# Patient Record
Sex: Male | Born: 1990 | Race: Black or African American | Hispanic: No | Marital: Single | Smoking: Current every day smoker
Health system: Southern US, Community
[De-identification: ages and names within clinical notes are randomized; demographics above are authoritative.]

## PROBLEM LIST (undated history)

## (undated) DIAGNOSIS — I10 Essential (primary) hypertension: Secondary | ICD-10-CM

## (undated) DIAGNOSIS — S61511A Laceration without foreign body of right wrist, initial encounter: Secondary | ICD-10-CM

## (undated) HISTORY — PX: NO PAST SURGERIES: SHX2092

---
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
## 2009-04-14 ENCOUNTER — Emergency Department (HOSPITAL_COMMUNITY): Admission: EM | Admit: 2009-04-14 | Discharge: 2009-04-14 | Payer: Self-pay | Admitting: Emergency Medicine

## 2009-04-17 ENCOUNTER — Emergency Department (HOSPITAL_COMMUNITY): Admission: EM | Admit: 2009-04-17 | Discharge: 2009-04-17 | Payer: Self-pay | Admitting: Emergency Medicine

## 2009-07-12 ENCOUNTER — Emergency Department (HOSPITAL_COMMUNITY): Admission: EM | Admit: 2009-07-12 | Discharge: 2009-07-12 | Payer: Self-pay | Admitting: Emergency Medicine

## 2009-07-15 ENCOUNTER — Emergency Department (HOSPITAL_COMMUNITY): Admission: EM | Admit: 2009-07-15 | Discharge: 2009-07-16 | Payer: Self-pay | Admitting: Emergency Medicine

## 2009-09-18 ENCOUNTER — Emergency Department (HOSPITAL_COMMUNITY): Admission: EM | Admit: 2009-09-18 | Discharge: 2009-09-18 | Payer: Self-pay | Admitting: Emergency Medicine

## 2009-09-21 ENCOUNTER — Emergency Department (HOSPITAL_COMMUNITY): Admission: EM | Admit: 2009-09-21 | Discharge: 2009-09-21 | Payer: Self-pay | Admitting: Emergency Medicine

## 2010-03-23 LAB — URINALYSIS, ROUTINE W REFLEX MICROSCOPIC
Bilirubin Urine: NEGATIVE
Protein, ur: NEGATIVE mg/dL
Urobilinogen, UA: 1 mg/dL (ref 0.0–1.0)
pH: 7.5 (ref 5.0–8.0)

## 2010-10-14 LAB — CULTURE, ROUTINE-ABSCESS

## 2011-07-14 ENCOUNTER — Encounter (HOSPITAL_COMMUNITY): Payer: Self-pay | Admitting: *Deleted

## 2011-07-14 ENCOUNTER — Emergency Department (HOSPITAL_COMMUNITY)
Admission: EM | Admit: 2011-07-14 | Discharge: 2011-07-14 | Disposition: A | Discharge status: Home / Self Care | Payer: Self-pay | Attending: Emergency Medicine | Admitting: Emergency Medicine

## 2011-07-14 DIAGNOSIS — F17200 Nicotine dependence, unspecified, uncomplicated: Secondary | ICD-10-CM | POA: Insufficient documentation

## 2011-07-14 DIAGNOSIS — R197 Diarrhea, unspecified: Secondary | ICD-10-CM | POA: Insufficient documentation

## 2011-07-14 NOTE — ED Notes (Signed)
MD at bedside. 

## 2011-07-14 NOTE — ED Provider Notes (Signed)
History     CSN: [id number]  Arrival date & time 07/14/11  0804   First MD Initiated Contact with Patient 07/14/11 [phone]      Chief Complaint  Patient presents with  . Diarrhea    (Consider location/radiation/quality/duration/timing/severity/associated sxs/prior treatment) HPI PT is concerned he has worms in his stool. States he has had several days of mild (2-3 episodes) of diarrhea. No fever chills, abdominal pain. Was looking at his solid stool today and thought he saw small worms. No recent trip out of the country. States he went camping this past weekend but did not drink any stream or river water.  History reviewed. No pertinent past medical history.  History reviewed. No pertinent past surgical history.  No family history on file.  History  Substance Use Topics  . Smoking status: Current Everyday Smoker  . Smokeless tobacco: Not on file  . Alcohol Use: Yes      Review of Systems  Constitutional: Negative for fever and chills.  Gastrointestinal: Positive for diarrhea. Negative for nausea, vomiting, abdominal pain, constipation and abdominal distention.  Genitourinary: Negative for dysuria and hematuria.  Skin: Negative for rash and wound.  Neurological: Negative for dizziness, weakness, numbness and headaches.  Psychiatric/Behavioral: The patient is nervous/anxious.     Allergies  Review of patient's allergies indicates no known allergies.  Home Medications  No current outpatient prescriptions on file.  BP 167/75  Pulse 86  Temp 98.4 F (36.9 C) (Oral)  Resp 16  Ht 6\' 4"  (1.93 m)  Wt 210 lb (95.255 kg)  BMI 25.56 kg/m2  SpO2 100%  Physical Exam  Nursing note and vitals reviewed. Constitutional: He is oriented to person, place, and time. He appears well-developed and well-nourished. No distress.       Anxious   HENT:  Head: Normocephalic and atraumatic.  Mouth/Throat: Oropharynx is clear and moist.  Eyes: EOM are normal. Pupils are equal, round, and  reactive to light.  Neck: Normal range of motion. Neck supple.  Cardiovascular: Normal rate and regular rhythm.   Pulmonary/Chest: Effort normal and breath sounds normal. No respiratory distress. He has no wheezes. He has no rales.  Abdominal: Soft. Bowel sounds are normal. There is no tenderness. There is no rebound and no guarding.  Musculoskeletal: Normal range of motion. He exhibits no edema and no tenderness.  Neurological: He is alert and oriented to person, place, and time.  Skin: Skin is warm and dry. No rash noted. No erythema.    ED Course  Procedures (including critical care time)   Labs Reviewed  STOOL CULTURE  OVA AND PARASITE EXAMINATION   No results found.   1. Diarrhea       MDM  Pt is unable to produce stool sample. Pt will be d/c with specimen container.         Loren Racer, MD 07/14/11 [phone]

## 2011-07-14 NOTE — ED Notes (Signed)
Pt reports diarrhea x 2 in last 3-4 days. This am reports saw "worms" in his stool. Denies pain. No vomiting/fever. 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

## 2012-03-05 ENCOUNTER — Emergency Department (HOSPITAL_COMMUNITY)
Admission: EM | Admit: 2012-03-05 | Discharge: 2012-03-05 | Disposition: A | Discharge status: Home / Self Care | Payer: Self-pay | Attending: Emergency Medicine | Admitting: Emergency Medicine

## 2012-03-05 ENCOUNTER — Telehealth (HOSPITAL_COMMUNITY): Payer: Self-pay | Admitting: Emergency Medicine

## 2012-03-05 ENCOUNTER — Encounter (HOSPITAL_COMMUNITY): Payer: Self-pay

## 2012-03-05 ENCOUNTER — Emergency Department (HOSPITAL_COMMUNITY): Payer: Self-pay

## 2012-03-05 DIAGNOSIS — S6000XA Contusion of unspecified finger without damage to nail, initial encounter: Secondary | ICD-10-CM | POA: Insufficient documentation

## 2012-03-05 DIAGNOSIS — S62639B Displaced fracture of distal phalanx of unspecified finger, initial encounter for open fracture: Secondary | ICD-10-CM

## 2012-03-05 DIAGNOSIS — Y92410 Unspecified street and highway as the place of occurrence of the external cause: Secondary | ICD-10-CM | POA: Insufficient documentation

## 2012-03-05 DIAGNOSIS — S62639A Displaced fracture of distal phalanx of unspecified finger, initial encounter for closed fracture: Secondary | ICD-10-CM | POA: Insufficient documentation

## 2012-03-05 DIAGNOSIS — F17200 Nicotine dependence, unspecified, uncomplicated: Secondary | ICD-10-CM | POA: Insufficient documentation

## 2012-03-05 DIAGNOSIS — I10 Essential (primary) hypertension: Secondary | ICD-10-CM | POA: Insufficient documentation

## 2012-03-05 DIAGNOSIS — S6710XA Crushing injury of unspecified finger(s), initial encounter: Secondary | ICD-10-CM | POA: Insufficient documentation

## 2012-03-05 DIAGNOSIS — Y939 Activity, unspecified: Secondary | ICD-10-CM | POA: Insufficient documentation

## 2012-03-05 HISTORY — DX: Essential (primary) hypertension: I10

## 2012-03-05 MED ORDER — OXYCODONE-ACETAMINOPHEN 5-325 MG PO TABS
2.0000 | ORAL_TABLET | Freq: Once | ORAL | Status: AC
  Administered 2012-03-05: 2 via ORAL
  Filled 2012-03-05: qty 2

## 2012-03-05 MED ORDER — CEPHALEXIN 500 MG PO CAPS: 500.0000 mg | ORAL_CAPSULE | Freq: Four times a day (QID) | ORAL | Status: DC

## 2012-03-05 MED ORDER — OXYCODONE-ACETAMINOPHEN 5-325 MG PO TABS: 1.0000 | ORAL_TABLET | Freq: Once | ORAL | Status: DC

## 2012-03-05 NOTE — ED Notes (Signed)
Pt states he slammed his right thumb in car door pta. Pt hand is wrapped in gauze, bleeding controlled. Pt states there is deformity in finger.

## 2012-03-05 NOTE — ED Provider Notes (Signed)
Medical screening examination/treatment/procedure(s) were performed by non-physician practitioner and as supervising physician I was immediately available for consultation/collaboration.   Laray Anger, DO 03/05/12 2054

## 2012-03-05 NOTE — ED Notes (Signed)
Pt returned from xray

## 2012-03-05 NOTE — ED Notes (Signed)
Discharge, medications, and follow up instructions reviewed. Pt verbalized understanding.

## 2012-03-05 NOTE — ED Notes (Signed)
Pt transported to xray 

## 2012-03-05 NOTE — ED Notes (Signed)
Pt states he slammed thumb in car door. Finger visibly bleeding and pt is having 10/10 pain.

## 2012-03-05 NOTE — Progress Notes (Signed)
Orthopedic Tech Progress Note Patient Details:  Daniel Spence Oct 23, 1990 [id number]  Ortho Devices Type of Ortho Device: Finger splint Ortho Device/Splint Location: right hand Ortho Device/Splint Interventions: Application   Nikki Dom 03/05/2012, 4:18 PM

## 2012-03-05 NOTE — ED Provider Notes (Signed)
History     CSN: [id number]  Arrival date & time 03/05/12  1339   First MD Initiated Contact with Patient 03/05/12 1353      Chief Complaint  Patient presents with  . Hand Injury    (Consider location/radiation/quality/duration/timing/severity/associated sxs/prior treatment) Patient is a 22 y.o. male presenting with hand injury. The history is provided by the patient.  Hand Injury Location:  Finger Finger location:  R thumb Associated symptoms comment:  Right thumb slammed in car door causing pain and bleeding around the nail. No other injury.   Past Medical History  Diagnosis Date  . Hypertension     History reviewed. No pertinent past surgical history.  History reviewed. No pertinent family history.  History  Substance Use Topics  . Smoking status: Current Every Day Smoker -- 0.25 packs/day  . Smokeless tobacco: Not on file  . Alcohol Use: Yes     Comment: occ      Review of Systems  Musculoskeletal:       See HPI  Skin: Positive for wound.  Neurological: Negative.  Negative for numbness.    Allergies  Review of patient's allergies indicates no known allergies.  Home Medications  No current outpatient prescriptions on file.  BP 147/71  Pulse 88  Temp(Src) 98.3 F (36.8 C)  Resp 22  SpO2 100%  Physical Exam  Constitutional: He is oriented to person, place, and time. He appears well-developed and well-nourished.  Neck: Normal range of motion.  Pulmonary/Chest: Effort normal.  Musculoskeletal: Normal range of motion.  Right thumb moderately swollen distally. Almost 100% subungual hematoma without avulsion of nail. No bony deformity identified.   Neurological: He is alert and oriented to person, place, and time.  Skin: Skin is warm and dry.  Psychiatric: He has a normal mood and affect.    ED Course  Procedures (including critical care time)  Labs Reviewed - No data to display No results found.  Dg Finger Thumb Right  03/05/2012  *RADIOLOGY  REPORT*  Clinical Data: Closed finger in car door.  RIGHT THUMB 2+V  Comparison: None  Findings: There is a fracture through the mid portion of the right thumb distal phalanx.  Slight angulation and displacement. Overlying soft tissue injury.  No additional acute bony abnormality.  IMPRESSION: Slightly displaced and angulated fracture through the right thumb distal phalanx.   Original Report Authenticated By: Charlett Nose, M.D.    No diagnosis found.  1. Crush injury 2. Phalanx fracture  MDM  Discussed with hand surgeon. Digital block performed and subungual hematoma relieved with putting a hole in nail. Splint applied and instructions given for follow up in office on Tuesday of this week.        Arnoldo Hooker, PA-C 03/05/12 1557

## 2012-11-24 ENCOUNTER — Emergency Department (HOSPITAL_COMMUNITY)
Admission: EM | Admit: 2012-11-24 | Discharge: 2012-11-24 | Disposition: A | Discharge status: Home / Self Care | Payer: Self-pay | Attending: Emergency Medicine | Admitting: Emergency Medicine

## 2012-11-24 ENCOUNTER — Encounter (HOSPITAL_COMMUNITY): Payer: Self-pay | Admitting: Emergency Medicine

## 2012-11-24 DIAGNOSIS — F17200 Nicotine dependence, unspecified, uncomplicated: Secondary | ICD-10-CM | POA: Insufficient documentation

## 2012-11-24 DIAGNOSIS — M549 Dorsalgia, unspecified: Secondary | ICD-10-CM

## 2012-11-24 DIAGNOSIS — I10 Essential (primary) hypertension: Secondary | ICD-10-CM | POA: Insufficient documentation

## 2012-11-24 DIAGNOSIS — IMO0002 Reserved for concepts with insufficient information to code with codable children: Secondary | ICD-10-CM | POA: Insufficient documentation

## 2012-11-24 DIAGNOSIS — Y929 Unspecified place or not applicable: Secondary | ICD-10-CM | POA: Insufficient documentation

## 2012-11-24 DIAGNOSIS — Y9389 Activity, other specified: Secondary | ICD-10-CM | POA: Insufficient documentation

## 2012-11-24 DIAGNOSIS — X500XXA Overexertion from strenuous movement or load, initial encounter: Secondary | ICD-10-CM | POA: Insufficient documentation

## 2012-11-24 MED ORDER — IBUPROFEN 800 MG PO TABS: 800.0000 mg | ORAL_TABLET | Freq: Three times a day (TID) | ORAL | Status: DC

## 2012-11-24 MED ORDER — HYDROCODONE-ACETAMINOPHEN 5-325 MG PO TABS: 1.0000 | ORAL_TABLET | ORAL | Status: DC | PRN

## 2012-11-24 MED ORDER — CYCLOBENZAPRINE HCL 10 MG PO TABS: 10.0000 mg | ORAL_TABLET | Freq: Two times a day (BID) | ORAL | Status: DC | PRN

## 2012-11-24 NOTE — ED Notes (Signed)
Pt c/o low back pain that started yesterday and goes into right leg.  Pt was lifting an object when pain started.
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
## 2012-11-24 NOTE — ED Provider Notes (Signed)
Medical screening examination/treatment/procedure(s) were performed by non-physician practitioner and as supervising physician I was immediately available for consultation/collaboration.    Celene Kras, MD 11/24/12 [phone]

## 2013-02-02 ENCOUNTER — Emergency Department (HOSPITAL_COMMUNITY)
Admission: EM | Admit: 2013-02-02 | Discharge: 2013-02-02 | Discharge status: Against Medical Advice | Payer: Self-pay | Attending: Emergency Medicine | Admitting: Emergency Medicine

## 2013-02-02 ENCOUNTER — Encounter (HOSPITAL_COMMUNITY): Payer: Self-pay | Admitting: Emergency Medicine

## 2013-02-02 DIAGNOSIS — F17200 Nicotine dependence, unspecified, uncomplicated: Secondary | ICD-10-CM | POA: Insufficient documentation

## 2013-02-02 DIAGNOSIS — R197 Diarrhea, unspecified: Secondary | ICD-10-CM | POA: Insufficient documentation

## 2013-02-02 DIAGNOSIS — Y929 Unspecified place or not applicable: Secondary | ICD-10-CM | POA: Insufficient documentation

## 2013-02-02 DIAGNOSIS — Y939 Activity, unspecified: Secondary | ICD-10-CM | POA: Insufficient documentation

## 2013-02-02 DIAGNOSIS — I10 Essential (primary) hypertension: Secondary | ICD-10-CM | POA: Insufficient documentation

## 2013-02-02 DIAGNOSIS — S0510XA Contusion of eyeball and orbital tissues, unspecified eye, initial encounter: Secondary | ICD-10-CM | POA: Insufficient documentation

## 2013-02-02 DIAGNOSIS — X58XXXA Exposure to other specified factors, initial encounter: Secondary | ICD-10-CM | POA: Insufficient documentation

## 2013-02-02 LAB — CBC WITH DIFFERENTIAL/PLATELET
BASOS PCT: 0 % (ref 0–1)
Basophils Absolute: 0 10*3/uL (ref 0.0–0.1)
EOS ABS: 0.1 10*3/uL (ref 0.0–0.7)
EOS PCT: 1 % (ref 0–5)
HCT: 45.8 % (ref 39.0–52.0)
HEMOGLOBIN: 16 g/dL (ref 13.0–17.0)
LYMPHS PCT: 22 % (ref 12–46)
Lymphs Abs: 2.7 10*3/uL (ref 0.7–4.0)
MCH: 30.7 pg (ref 26.0–34.0)
MCHC: 34.9 g/dL (ref 30.0–36.0)
MCV: 87.7 fL (ref 78.0–100.0)
MONO ABS: 0.7 10*3/uL (ref 0.1–1.0)
MONOS PCT: 6 % (ref 3–12)
NEUTROS PCT: 72 % (ref 43–77)
Neutro Abs: 8.8 10*3/uL — ABNORMAL HIGH (ref 1.7–7.7)
Platelets: 274 10*3/uL (ref 150–400)
RBC: 5.22 MIL/uL (ref 4.22–5.81)
RDW: 12.7 % (ref 11.5–15.5)
WBC: 12.3 10*3/uL — AB (ref 4.0–10.5)

## 2013-02-02 LAB — COMPREHENSIVE METABOLIC PANEL
ALBUMIN: 4.5 g/dL (ref 3.5–5.2)
ALT: 28 U/L (ref 0–53)
AST: 28 U/L (ref 0–37)
Alkaline Phosphatase: 57 U/L (ref 39–117)
BUN: 12 mg/dL (ref 6–23)
CALCIUM: 9.6 mg/dL (ref 8.4–10.5)
CO2: 25 mEq/L (ref 19–32)
Chloride: 101 mEq/L (ref 96–112)
Creatinine, Ser: 1.14 mg/dL (ref 0.50–1.35)
GFR calc Af Amer: >90 mL/min (ref >90)
GFR calc non Af Amer: 89 mL/min — ABNORMAL LOW (ref >90)
GLUCOSE: 93 mg/dL (ref 70–99)
POTASSIUM: 3.8 meq/L (ref 3.7–5.3)
Sodium: 141 mEq/L (ref 137–147)
TOTAL PROTEIN: 7.8 g/dL (ref 6.0–8.3)
Total Bilirubin: 0.5 mg/dL (ref 0.3–1.2)

## 2013-02-02 LAB — LIPASE, BLOOD: LIPASE: 13 U/L (ref 11–59)

## 2013-02-02 NOTE — ED Notes (Signed)
Patient states that he cannot stay.  He has 5 children waiting for him at home.  Patient states that he is feeling better now and wants to leave AMA.

## 2013-02-02 NOTE — ED Notes (Signed)
Pt reports having decreased appetite and diarrhea for several days, denies vomiting or abd pain. Also had injury to left eye and now having irritation to eye, denies vision changes.

## 2013-02-03 ENCOUNTER — Encounter (HOSPITAL_COMMUNITY): Payer: Self-pay | Admitting: Emergency Medicine

## 2013-02-03 ENCOUNTER — Emergency Department (HOSPITAL_COMMUNITY)
Admission: EM | Admit: 2013-02-03 | Discharge: 2013-02-03 | Disposition: A | Discharge status: Home / Self Care | Payer: Self-pay | Attending: Emergency Medicine | Admitting: Emergency Medicine

## 2013-02-03 DIAGNOSIS — Z791 Long term (current) use of non-steroidal anti-inflammatories (NSAID): Secondary | ICD-10-CM | POA: Insufficient documentation

## 2013-02-03 DIAGNOSIS — R109 Unspecified abdominal pain: Secondary | ICD-10-CM | POA: Insufficient documentation

## 2013-02-03 DIAGNOSIS — R110 Nausea: Secondary | ICD-10-CM | POA: Insufficient documentation

## 2013-02-03 DIAGNOSIS — H5789 Other specified disorders of eye and adnexa: Secondary | ICD-10-CM | POA: Insufficient documentation

## 2013-02-03 DIAGNOSIS — Z79899 Other long term (current) drug therapy: Secondary | ICD-10-CM | POA: Insufficient documentation

## 2013-02-03 DIAGNOSIS — I10 Essential (primary) hypertension: Secondary | ICD-10-CM | POA: Insufficient documentation

## 2013-02-03 DIAGNOSIS — K089 Disorder of teeth and supporting structures, unspecified: Secondary | ICD-10-CM | POA: Insufficient documentation

## 2013-02-03 DIAGNOSIS — K0889 Other specified disorders of teeth and supporting structures: Secondary | ICD-10-CM

## 2013-02-03 DIAGNOSIS — F17200 Nicotine dependence, unspecified, uncomplicated: Secondary | ICD-10-CM | POA: Insufficient documentation

## 2013-02-03 MED ORDER — PENICILLIN V POTASSIUM 500 MG PO TABS: 500.0000 mg | ORAL_TABLET | Freq: Three times a day (TID) | ORAL | Status: DC

## 2013-02-03 MED ORDER — CARBOXYMETHYLCELLULOSE SODIUM 1 % OP SOLN: 1.0000 [drp] | Freq: Three times a day (TID) | OPHTHALMIC | Status: DC

## 2013-02-03 MED ORDER — TRAMADOL HCL 50 MG PO TABS: 50.0000 mg | ORAL_TABLET | Freq: Four times a day (QID) | ORAL | Status: DC | PRN

## 2013-02-03 MED ORDER — FLUORESCEIN SODIUM 1 MG OP STRP
1.0000 | ORAL_STRIP | Freq: Once | OPHTHALMIC | Status: AC
  Administered 2013-02-03: 1 via OPHTHALMIC
  Filled 2013-02-03: qty 1

## 2013-02-03 MED ORDER — TETRACAINE HCL 0.5 % OP SOLN
1.0000 [drp] | Freq: Once | OPHTHALMIC | Status: AC
  Administered 2013-02-03: 1 [drp] via OPHTHALMIC
  Filled 2013-02-03: qty 2

## 2013-02-03 NOTE — ED Provider Notes (Signed)
CSN: [id number]     Arrival date & time 02/03/13  1024 History  This chart was scribed for non-physician practitioner, Roxy Horseman, PA-C working with Derwood Kaplan, MD by Greggory Stallion, ED scribe. This patient was seen in room TR08C/TR08C and the patient's care was started at 11:18 AM.   Chief Complaint  Patient presents with  . Dental Pain   The history is provided by the patient. No language interpreter was used.   HPI Comments: Daniel Spence is a 23 y.o. male who presents to the Emergency Department complaining of gradual onset, constant left dental pain that started 2 weeks ago. Cold air worsens the pain. He is also complaining of mild abdominal pain and nausea. Pt states his kids at home are sick with a stomach bug. Denies fever, emesis. Pt also has left eye irritation. He states he think he got dirt in his eye at work.   Past Medical History  Diagnosis Date  . Hypertension    History reviewed. No pertinent past surgical history. No family history on file. History  Substance Use Topics  . Smoking status: Current Every Day Smoker -- 0.25 packs/day  . Smokeless tobacco: Not on file  . Alcohol Use: Yes     Comment: occ    Review of Systems  Constitutional: Negative for fever.  HENT: Positive for dental problem.   Eyes: Positive for pain.  Gastrointestinal: Positive for nausea and abdominal pain. Negative for vomiting.    Allergies  Review of patient's allergies indicates no known allergies.  Home Medications   Current Outpatient Rx  Name  Route  Sig  Dispense  Refill  . cyclobenzaprine (FLEXERIL) 10 MG tablet   Oral   Take 1 tablet (10 mg total) by mouth 2 (two) times daily as needed for muscle spasms.   20 tablet   0   . HYDROcodone-acetaminophen (NORCO/VICODIN) 5-325 MG per tablet   Oral   Take 1-2 tablets by mouth every 4 (four) hours as needed.   10 tablet   0   . ibuprofen (ADVIL,MOTRIN) 800 MG tablet   Oral   Take 1 tablet (800 mg total) by mouth 3  (three) times daily.   21 tablet   0   . Multiple Vitamins-Minerals (MULTIVITAMIN WITH MINERALS) tablet   Oral   Take 1 tablet by mouth daily.          BP 136/67  Pulse 89  Temp(Src) 98.3 F (36.8 C) (Oral)  Resp 16  SpO2 97%  Physical Exam  Nursing note and vitals reviewed. Constitutional: He is oriented to person, place, and time. He appears well-developed. No distress.  HENT:  Head: Normocephalic and atraumatic.  Mouth/Throat:    Poor dentition throughout.  Affected tooth as diagrammed.  No signs of peritonsillar or tonsillar abscess.  No signs of gingival abscess. Oropharynx is clear and without exudates.  Uvula is midline.  Airway is intact. No signs of Ludwig's angina with palpation of oral and sublingual mucosa.   Eyes: Conjunctivae and EOM are normal.  Visual acuity bilateral 20/10. Right 20/10. Left 20/15. No foreign bodies observed. No evidence of conjunctivitis. No corneal abrasion seen on woods lamp.   Cardiovascular: Normal rate and regular rhythm.   Pulmonary/Chest: Effort normal. No stridor. No respiratory distress.  Abdominal: He exhibits no distension.  No focal abdominal tenderness. No pain in RLQ or at McBurney's point, no RUQ tenderness or Murphy's sign. No let sided abdominal tenderness. No fluid wave or signs of peritonitis.  Musculoskeletal: He exhibits no edema.  Neurological: He is alert and oriented to person, place, and time.  Skin: Skin is warm and dry.  Psychiatric: He has a normal mood and affect.    ED Course  Procedures (including critical care time)  DIAGNOSTIC STUDIES: Oxygen Saturation is 97% on RA, normal by my interpretation.    COORDINATION OF CARE: 11:22 AM-Discussed treatment plan which includes an antibiotic for his dental problem and checking for corneal abrasion with pt at bedside and pt agreed to plan.   Labs Review Labs Reviewed - No data to display Imaging Review No results found.  EKG Interpretation   None        MDM   1. Pain, dental   2. Eye irritation   3. Abdominal pain      No focal abdominal tenderness. Vital signs are reassuring. No vomiting or diarrhea. He should states that he is feeling better today. Return precautions are given.  Roxy Horsemanobert Lissandra Keil, PA-C 02/03/13 [phone]

## 2013-02-03 NOTE — Discharge Instructions (Signed)
Abdominal Pain, Adult Many things can cause abdominal pain. Usually, abdominal pain is not caused by a disease and will improve without treatment. It can often be observed and treated at home. Your health care provider will do a physical exam and possibly order blood tests and X-rays to help determine the seriousness of your pain. However, in many cases, more time must pass before a clear cause of the pain can be found. Before that point, your health care provider may not know if you need more testing or further treatment. HOME CARE INSTRUCTIONS  Monitor your abdominal pain for any changes. The following actions may help to alleviate any discomfort you are experiencing:  Only take over-the-counter or prescription medicines as directed by your health care provider.  Do not take laxatives unless directed to do so by your health care provider.  Try a clear liquid diet (broth, tea, or water) as directed by your health care provider. Slowly move to a bland diet as tolerated. SEEK MEDICAL CARE IF:  You have unexplained abdominal pain.  You have abdominal pain associated with nausea or diarrhea.  You have pain when you urinate or have a bowel movement.  You experience abdominal pain that wakes you in the night.  You have abdominal pain that is worsened or improved by eating food.  You have abdominal pain that is worsened with eating fatty foods. SEEK IMMEDIATE MEDICAL CARE IF:   Your pain does not go away within 2 hours.  You have a fever.  You keep throwing up (vomiting).  Your pain is felt only in portions of the abdomen, such as the right side or the left lower portion of the abdomen.  You pass bloody or black tarry stools. MAKE SURE YOU:  Understand these instructions.   Will watch your condition.   Will get help right away if you are not doing well or get worse.  Document Released: 10/01/2004 Document Revised: 10/12/2012 Document Reviewed: 08/31/2012 [facility] Patient  Information 2014 [employer] [place]. Dental Pain A tooth ache may be caused by cavities (tooth decay). Cavities expose the nerve of the tooth to air and hot or cold temperatures. It may come from an infection or abscess (also called a boil or furuncle) around your tooth. It is also often caused by dental caries (tooth decay). This causes the pain you are having. DIAGNOSIS  Your caregiver can diagnose this problem by exam. TREATMENT   If caused by an infection, it may be treated with medications which kill germs (antibiotics) and pain medications as prescribed by your caregiver. Take medications as directed.  Only take over-the-counter or prescription medicines for pain, discomfort, or fever as directed by your caregiver.  Whether the tooth ache today is caused by infection or dental disease, you should see your dentist as soon as possible for further care. SEEK MEDICAL CARE IF: The exam and treatment you received today has been provided on an emergency basis only. This is not a substitute for complete medical or dental care. If your problem worsens or new problems (symptoms) appear, and you are unable to meet with your dentist, call or return to this location. SEEK IMMEDIATE MEDICAL CARE IF:   You have a fever.  You develop redness and swelling of your face, jaw, or neck.  You are unable to open your mouth.  You have severe pain uncontrolled by pain medicine. MAKE SURE YOU:   Understand these instructions.  Will watch your condition.  Will get help right away if you are  not doing well or get worse. Document Released: 12/22/2004 Document Revised: 03/16/2011 Document Reviewed: 08/10/2007 [facility] Patient Information 2014 [place].

## 2013-02-03 NOTE — ED Notes (Signed)
Dental pain  Left side for 2 weeks

## 2013-02-04 NOTE — ED Provider Notes (Signed)
Medical screening examination/treatment/procedure(s) were performed by non-physician practitioner and as supervising physician I was immediately available for consultation/collaboration.  EKG Interpretation   None        Erika Hussar, MD 02/04/13 0715 

## 2013-10-19 ENCOUNTER — Encounter (HOSPITAL_COMMUNITY): Payer: Self-pay | Admitting: Emergency Medicine

## 2013-10-19 ENCOUNTER — Emergency Department (HOSPITAL_COMMUNITY): Payer: Self-pay

## 2013-10-19 ENCOUNTER — Emergency Department (HOSPITAL_COMMUNITY)
Admission: EM | Admit: 2013-10-19 | Discharge: 2013-10-19 | Disposition: A | Discharge status: Home / Self Care | Payer: Self-pay | Attending: Emergency Medicine | Admitting: Emergency Medicine

## 2013-10-19 DIAGNOSIS — I10 Essential (primary) hypertension: Secondary | ICD-10-CM | POA: Insufficient documentation

## 2013-10-19 DIAGNOSIS — R6883 Chills (without fever): Secondary | ICD-10-CM | POA: Insufficient documentation

## 2013-10-19 DIAGNOSIS — Z720 Tobacco use: Secondary | ICD-10-CM | POA: Insufficient documentation

## 2013-10-19 DIAGNOSIS — R079 Chest pain, unspecified: Secondary | ICD-10-CM

## 2013-10-19 DIAGNOSIS — R05 Cough: Secondary | ICD-10-CM | POA: Insufficient documentation

## 2013-10-19 DIAGNOSIS — R61 Generalized hyperhidrosis: Secondary | ICD-10-CM | POA: Insufficient documentation

## 2013-10-19 DIAGNOSIS — Z8659 Personal history of other mental and behavioral disorders: Secondary | ICD-10-CM | POA: Insufficient documentation

## 2013-10-19 LAB — I-STAT CHEM 8, ED
BUN: 11 mg/dL (ref 6–23)
CALCIUM ION: 1.17 mmol/L (ref 1.12–1.23)
Chloride: 101 mEq/L (ref 96–112)
Creatinine, Ser: 1.2 mg/dL (ref 0.50–1.35)
GLUCOSE: 105 mg/dL — AB (ref 70–99)
HEMATOCRIT: 52 % (ref 39.0–52.0)
HEMOGLOBIN: 17.7 g/dL — AB (ref 13.0–17.0)
Potassium: 4 mEq/L (ref 3.7–5.3)
Sodium: 140 mEq/L (ref 137–147)
TCO2: 26 mmol/L (ref 0–100)

## 2013-10-19 LAB — I-STAT TROPONIN, ED: Troponin i, poc: 0 ng/mL (ref 0.00–0.08)

## 2013-10-19 LAB — D-DIMER, QUANTITATIVE (NOT AT ARMC): D-Dimer, Quant: <0.27 ug/mL-FEU (ref 0.00–0.48)

## 2013-10-19 MED ORDER — NAPROXEN 500 MG PO TABS: 500.0000 mg | ORAL_TABLET | Freq: Two times a day (BID) | ORAL | Status: DC

## 2013-10-19 MED ORDER — KETOROLAC TROMETHAMINE 60 MG/2ML IM SOLN
60.0000 mg | Freq: Once | INTRAMUSCULAR | Status: AC
  Administered 2013-10-19: 60 mg via INTRAMUSCULAR
  Filled 2013-10-19: qty 2

## 2013-10-19 NOTE — ED Provider Notes (Signed)
CSN: [id number]     Arrival date & time 10/19/13  0919 History   First MD Initiated Contact with Patient 10/19/13 [phone]     Chief Complaint  Patient presents with  . Chest Pain     (Consider location/radiation/quality/duration/timing/severity/associated sxs/prior Treatment) HPI Comments: Pt is a 23 y/o male with no sgi PMH - presents with c/o R sided CP - feels like someone is stabbing him, worse with movement of the R arm but is also constant.  Radiates to the mid back in the upper Thoracic area.  No swelling, no other RF for PE other than smoking.  He has no exertional sx and does both cardio and strength training regularly.  Denies fevers but has been coughing for 2 days with sweats and chills occasaionlly.  Patient is a 23 y.o. male presenting with chest pain. The history is provided by the patient.  Chest Pain   Past Medical History  Diagnosis Date  . Hypertension   . Anxiety    History reviewed. No pertinent past surgical history. No family history on file. History  Substance Use Topics  . Smoking status: Current Every Day Smoker -- 0.25 packs/day  . Smokeless tobacco: Not on file  . Alcohol Use: Yes     Comment: occ    Review of Systems  Cardiovascular: Positive for chest pain.  All other systems reviewed and are negative.     Allergies  Review of patient's allergies indicates no known allergies.  Home Medications   Prior to Admission medications   Medication Sig Start Date End Date Taking? Authorizing Provider  DM-Doxylamine-Acetaminophen (NYQUIL COLD & FLU PO) Take 1 tablet by mouth daily as needed (for cold/flu symptoms).   Yes Historical Provider, MD  naproxen (NAPROSYN) 500 MG tablet Take 1 tablet (500 mg total) by mouth 2 (two) times daily with a meal. 10/19/13   Vida RollerBrian D Oneida Mckamey, MD   BP 156/66  Pulse 70  Temp(Src) 98.1 F (36.7 C) (Oral)  Resp 18  SpO2 100% Physical Exam  Nursing note and vitals reviewed. Constitutional: He appears well-developed  and well-nourished. No distress.  HENT:  Head: Normocephalic and atraumatic.  Mouth/Throat: Oropharynx is clear and moist. No oropharyngeal exudate.  Eyes: Conjunctivae and EOM are normal. Pupils are equal, round, and reactive to light. Right eye exhibits no discharge. Left eye exhibits no discharge. No scleral icterus.  Neck: Normal range of motion. Neck supple. No JVD present. No thyromegaly present.  Cardiovascular: Normal rate, regular rhythm, normal heart sounds and intact distal pulses.  Exam reveals no gallop and no friction rub.   No murmur heard. Pulmonary/Chest: Effort normal and breath sounds normal. No respiratory distress. He has no wheezes. He has no rales.  Abdominal: Soft. Bowel sounds are normal. He exhibits no distension and no mass. There is no tenderness.  Musculoskeletal: Normal range of motion. He exhibits no edema and no tenderness.  Lymphadenopathy:    He has no cervical adenopathy.  Neurological: He is alert. Coordination normal.  Skin: Skin is warm and dry. No rash noted. No erythema.  Psychiatric: He has a normal mood and affect. His behavior is normal.    ED Course  Procedures (including critical care time) Labs Review Labs Reviewed  I-STAT CHEM 8, ED - Abnormal; Notable for the following:    Glucose, Bld 105 (*)    Hemoglobin 17.7 (*)    All other components within normal limits  D-DIMER, QUANTITATIVE  I-STAT TROPOININ, ED    Imaging Review Dg  Chest 2 View  10/19/2013   CLINICAL DATA:  Right side chest pain and shortness of breath.  EXAM: CHEST  2 VIEW  COMPARISON:  PA and lateral chest 04/14/2009.  FINDINGS: Heart size and mediastinal contours are within normal limits. Both lungs are clear. Visualized skeletal structures are unremarkable.  IMPRESSION: Negative exam.   Electronically Signed   By: Drusilla Kannerhomas  Dalessio M.D.   On: 10/19/2013 10:07     EKG Interpretation   Date/Time:  Thursday October 19 2013 09:28:19 EDT Ventricular Rate:  92 PR  Interval:  120 QRS Duration: 80 QT Interval:  332 QTC Calculation: 410 R Axis:   91 Text Interpretation:  Normal sinus rhythm with sinus arrhythmia Rightward  axis Borderline ECG No old tracing to compare Confirmed by Daxter Paule  MD,  Jenaye Rickert ([id number]) on 10/19/2013 9:34:35 AM      MDM   Final diagnoses:  Chest pain, unspecified chest pain type    Normal exam, ECG without any ischemic findings - pain is not cardiac in nature, possible PTX / PNA or pleuritis.  Xray and labs pending.  Toradol.  Xray normal, labs including dimer normal, low risk PE / ACS, pt given reassurance  Meds given in ED:  Medications  ketorolac (TORADOL) injection 60 mg (60 mg Intramuscular Given 10/19/13 0954)    New Prescriptions   NAPROXEN (NAPROSYN) 500 MG TABLET    Take 1 tablet (500 mg total) by mouth 2 (two) times daily with a meal.        Vida RollerBrian D Aaleah Hirsch, MD 10/19/13 1116

## 2013-10-19 NOTE — ED Notes (Signed)
Pt back from x-ray.

## 2013-10-19 NOTE — ED Notes (Signed)
Repots cp for the last 3 days. Is right sided and is sharp, stabbing pain. Is a x 4. Skin is clammy. VSS. Denies cardiac hx

## 2013-10-19 NOTE — Discharge Instructions (Signed)
Your caregiver has diagnosed you as having chest pain that is nonspecific for one problem. This means that after looking at you and examining you and ordering tests (such as blood work, chest x-rays and EKG), your caregiver does not believe that the problem is serious enough to need watching in the hospital. This judgment is often made after testing shows no acute heart attack and you are at low risk for sudden acute heart condition. Chest pain comes from many different causes.  Seek immediate medical attention if:   You have severe chest pain, especially if the pain is crushing or pressure-like and spreads to the arms, back, neck, or jaw, or if you have sweating, nausea, shortness of breath. This is an emergency. Don't wait to see if the pain will go away. Get medical help at once. Call 911 immediately. Do not drive herself to the hospital.   Your chest pain gets worse and does not go away with rest.   You have an attack of chest pain lasting longer than usual, despite rest and treatment with the medications your caregiver has prescribed   You awaken from sleep with chest pain or shortness of breath.   You feel faint or dizzy   You have chest pain not typical of your usual pain for which you originally saw your caregiver.  You must have a repeat evaluation within 24 hours for a recheck of your heart.  Please call your doctor this morning to schedule this appointment. If you do not have a family doctor, please see the list of doctors below.  RESOURCE GUIDE  Dental Problems  Patients with Medicaid: [facility]                     [facility] [address]                                           [address] Phone:  [phone]                                                  Phone:  [phone]  If unable to pay or uninsured, contact:  [facility] or [facility] to become qualified for the adult dental clinic.  Chronic Pain  Problems Contact [facility]  [phone] Patients need to be referred by their primary care doctor.  Insufficient Money for Medicine Contact [employer]:  call "211" or [facility] 271-5999.  No Primary Care Doctor Call [facility]  832-8000 Other agencies that provide inexpensive medical care    [facility]  832-8035    [facility]  [phone]    [facility]  [phone]    [facility]  832-4777    [facility]  [phone]    [facility]  [phone]  Psychological Services [facility]  832-9600 [facility]  [phone] [facility]   800 853-5163 (emergency services 641-4993)  Substance Abuse Resources [facility]  336-882-2125 [facility] 336-784-9470 [facility] 336-285-9073 [facility] 336-845-3988 Residential & Outpatient Substance Abuse Program  800-659-3381  Abuse/Neglect [place] Child Abuse Hotline (336) 641-3795 [place] [facility] Abuse Hotline 800-378-5315 (After Hours)  Emergency Shelter [facility] (336) 271-5985  [facility] (336) 275-9566 [facility] (704) 372-4663  MRSA Hotline #:   832-7006    [place] [facility]  [facility] [place]     [employer]                          [facility] [address] [place]                       [address]      [address]  [employer]                                                [place]                            [place] Phone:  [phone]                                   Phone:  [phone]                 Phone:  [phone]  [facility] Phone:  [phone]  [place] Child Abuse Hotline (336) 342-1394 (336) 342-3537 (After Hours)    

## 2014-07-10 ENCOUNTER — Emergency Department (HOSPITAL_COMMUNITY)
Admission: EM | Admit: 2014-07-10 | Discharge: 2014-07-10 | Disposition: A | Discharge status: Home / Self Care | Payer: Self-pay | Attending: Emergency Medicine | Admitting: Emergency Medicine

## 2014-07-10 ENCOUNTER — Encounter (HOSPITAL_COMMUNITY): Payer: Self-pay | Admitting: Emergency Medicine

## 2014-07-10 DIAGNOSIS — Z720 Tobacco use: Secondary | ICD-10-CM | POA: Insufficient documentation

## 2014-07-10 DIAGNOSIS — I10 Essential (primary) hypertension: Secondary | ICD-10-CM | POA: Insufficient documentation

## 2014-07-10 DIAGNOSIS — Z8659 Personal history of other mental and behavioral disorders: Secondary | ICD-10-CM | POA: Insufficient documentation

## 2014-07-10 DIAGNOSIS — K047 Periapical abscess without sinus: Secondary | ICD-10-CM | POA: Insufficient documentation

## 2014-07-10 MED ORDER — NAPROXEN 500 MG PO TABS: 500.0000 mg | ORAL_TABLET | Freq: Two times a day (BID) | ORAL | Status: DC

## 2014-07-10 MED ORDER — AMOXICILLIN 500 MG PO CAPS: 500.0000 mg | ORAL_CAPSULE | Freq: Three times a day (TID) | ORAL | Status: DC

## 2014-07-10 MED ORDER — HYDROCODONE-ACETAMINOPHEN 5-325 MG PO TABS: 1.0000 | ORAL_TABLET | Freq: Four times a day (QID) | ORAL | Status: DC | PRN

## 2014-07-10 NOTE — ED Provider Notes (Signed)
CSN: [id number]     Arrival date & time 07/10/14  1702 History   This chart was scribed for non-physician practitioner Kerrie Buffalo, NP working with Elwin Mocha, MD by Lyndel Safe, ED Scribe. This patient was seen in room TR07C/TR07C and the patient's care was started at 5:10 PM.    Chief Complaint  Patient presents with  . Dental Pain   Patient is a 24 y.o. male presenting with tooth pain. The history is provided by the patient. No language interpreter was used.  Dental Pain Location:  Lower Severity:  Moderate Duration:  3 days Timing:  Constant Progression:  Worsening Chronicity:  New Context: dental caries   Relieved by:  Nothing Ineffective treatments:  Acetaminophen and NSAIDs Associated symptoms: gum swelling   Associated symptoms: no fever    HPI Comments: Daniel Spence is a 24 y.o. male, with a PMhx of HTN and anxiety, who presents to the Emergency Department complaining of gradually worsening, constant, moderate lower left dental pain that began 3 days ago. Pt has a history of similar dental problems but states he cannot afford a dentist. He has tried multiple OTC pain medications with mild to no relief. Denies fevers or chills.    Past Medical History  Diagnosis Date  . Hypertension   . Anxiety    History reviewed. No pertinent past surgical history. No family history on file. History  Substance Use Topics  . Smoking status: Current Every Day Smoker -- 0.25 packs/day  . Smokeless tobacco: Not on file  . Alcohol Use: Yes     Comment: occ    Review of Systems  Constitutional: Negative for fever and chills.  HENT: Positive for dental problem.   All other systems reviewed and are negative.   Allergies  Review of patient's allergies indicates no known allergies.  Home Medications   Prior to Admission medications   Medication Sig Start Date End Date Taking? Authorizing Provider  amoxicillin (AMOXIL) 500 MG capsule Take 1 capsule (500 mg total) by mouth 3  (three) times daily. 07/10/14   Oluwatoyin Banales Orlene Och, NP  DM-Doxylamine-Acetaminophen (NYQUIL COLD & FLU PO) Take 1 tablet by mouth daily as needed (for cold/flu symptoms).    Historical Provider, MD  HYDROcodone-acetaminophen (NORCO) 5-325 MG per tablet Take 1 tablet by mouth every 6 (six) hours as needed. 07/10/14   Breia Ocampo Orlene Och, NP  naproxen (NAPROSYN) 500 MG tablet Take 1 tablet (500 mg total) by mouth 2 (two) times daily. 07/10/14   Taren Toops Orlene Och, NP   BP 160/81 mmHg  Pulse 72  Temp(Src) 98.7 F (37.1 C) (Oral)  Resp 20  Ht  (1.93 m)  Wt 205 lb (92.987 kg)  BMI 24.96 kg/m2  SpO2 98% Physical Exam  Constitutional: He is oriented to person, place, and time. He appears well-developed and well-nourished.  HENT:  Head: Normocephalic.  Right Ear: Tympanic membrane and external ear normal.  Left Ear: Tympanic membrane and external ear normal.  Mouth/Throat: Oropharynx is clear and moist.    Uvula midline; no erythema or edema. Second molar on the lower left side is tender on exam; there is swelling and erythema to the gum surrounding the tooth.   Eyes: Conjunctivae and EOM are normal. Pupils are equal, round, and reactive to light.  Neck: Normal range of motion. Neck supple.  Anterior cervical lymph node enlarged on the left side.   Cardiovascular: Normal rate and regular rhythm.   Pulmonary/Chest: Effort normal and breath sounds normal.  Abdominal:  Soft. There is no tenderness.  Musculoskeletal: Normal range of motion.  Lymphadenopathy:    He has cervical adenopathy.  Neurological: He is alert and oriented to person, place, and time. No cranial nerve deficit.  Skin: Skin is warm and dry.  Psychiatric: He has a normal mood and affect. His behavior is normal.  Nursing note and vitals reviewed.   ED Course  Procedures  DIAGNOSTIC STUDIES: Oxygen Saturation is 98% on RA, normal by my interpretation.    COORDINATION OF CARE: 5:13 PM Discussed treatment plan which includes to order and  prescribe pain medication and start pt on an antibiotic course. Pt acknowledges and agrees to plan.   MDM  24 y.o. male with dental pain due to abscess. Stable for d/c without fever and does not appear toxic. Will start antibiotics and will give the name of dentist on call. Discussed with the patient and all questioned fully answered. He will return if any problems arise.  Final diagnoses:  Dental abscess   I personally performed the services described in this documentation, which was scribed in my presence. The recorded information has been reviewed and is accurate.    [address] Daquarius Dubeau, NP 07/10/14 2255  Elwin MochaBlair Walden, MD 07/11/14 [phone]

## 2014-07-10 NOTE — Discharge Instructions (Signed)
Do not drive while taking the narcotic. Call Dr. Leanord AsalFarless office tomorrow to schedule follow up.  Dental Abscess A dental abscess is a collection of infected fluid (pus) from a bacterial infection in the inner part of the tooth (pulp). It usually occurs at the end of the tooth's root.  CAUSES   Severe tooth decay.  Trauma to the tooth that allows bacteria to enter into the pulp, such as a broken or chipped tooth. SYMPTOMS   Severe pain in and around the infected tooth.  Swelling and redness around the abscessed tooth or in the mouth or face.  Tenderness.  Pus drainage.  Bad breath.  Bitter taste in the mouth.  Difficulty swallowing.  Difficulty opening the mouth.  Nausea.  Vomiting.  Chills.  Swollen neck glands. DIAGNOSIS   A medical and dental history will be taken.  An examination will be performed by tapping on the abscessed tooth.  X-rays may be taken of the tooth to identify the abscess. TREATMENT The goal of treatment is to eliminate the infection. You may be prescribed antibiotic medicine to stop the infection from spreading. A root canal may be performed to save the tooth. If the tooth cannot be saved, it may be pulled (extracted) and the abscess may be drained.  HOME CARE INSTRUCTIONS  Only take over-the-counter or prescription medicines for pain, fever, or discomfort as directed by your caregiver.  Rinse your mouth (gargle) often with salt water ( tsp salt in 8 oz [250 ml] of warm water) to relieve pain or swelling.  Do not drive after taking pain medicine (narcotics).  Do not apply heat to the outside of your face.  Return to your dentist for further treatment as directed. SEEK MEDICAL CARE IF:  Your pain is not helped by medicine.  Your pain is getting worse instead of better. SEEK IMMEDIATE MEDICAL CARE IF:  You have a fever or persistent symptoms for more than 2-3 days.  You have a fever and your symptoms suddenly get worse.  You have  chills or a very bad headache.  You have problems breathing or swallowing.  You have trouble opening your mouth.  You have swelling in the neck or around the eye. Document Released: 12/22/2004 Document Revised: 09/16/2011 Document Reviewed: 04/01/2010 [facility] Patient Information 2015 [employer] [place]. This information is not intended to replace advice given to you by your health care provider. Make sure you discuss any questions you have with your health care provider.

## 2014-07-10 NOTE — ED Notes (Signed)
Pt sts he had dental abscess that he tried to fix himself by putting some type of filling in it. Pain to bottom left molar. Denies fevers/chills.

## 2014-07-19 ENCOUNTER — Emergency Department (HOSPITAL_COMMUNITY)
Admission: EM | Admit: 2014-07-19 | Discharge: 2014-07-19 | Disposition: A | Discharge status: Home / Self Care | Payer: MEDICAID

## 2014-07-19 NOTE — ED Notes (Signed)
Called for triage no answer  

## 2014-11-28 ENCOUNTER — Emergency Department (HOSPITAL_COMMUNITY)
Admission: EM | Admit: 2014-11-28 | Discharge: 2014-11-28 | Disposition: A | Discharge status: Home / Self Care | Payer: Self-pay | Attending: Emergency Medicine | Admitting: Emergency Medicine

## 2014-11-28 ENCOUNTER — Encounter (HOSPITAL_COMMUNITY): Payer: Self-pay | Admitting: Emergency Medicine

## 2014-11-28 DIAGNOSIS — I10 Essential (primary) hypertension: Secondary | ICD-10-CM | POA: Insufficient documentation

## 2014-11-28 DIAGNOSIS — Z792 Long term (current) use of antibiotics: Secondary | ICD-10-CM | POA: Insufficient documentation

## 2014-11-28 DIAGNOSIS — M545 Low back pain, unspecified: Secondary | ICD-10-CM

## 2014-11-28 DIAGNOSIS — Z8659 Personal history of other mental and behavioral disorders: Secondary | ICD-10-CM | POA: Insufficient documentation

## 2014-11-28 DIAGNOSIS — F17200 Nicotine dependence, unspecified, uncomplicated: Secondary | ICD-10-CM | POA: Insufficient documentation

## 2014-11-28 MED ORDER — IBUPROFEN 800 MG PO TABS: 800.0000 mg | ORAL_TABLET | Freq: Three times a day (TID) | ORAL | Status: DC | PRN

## 2014-11-28 MED ORDER — KETOROLAC TROMETHAMINE 60 MG/2ML IM SOLN
60.0000 mg | Freq: Once | INTRAMUSCULAR | Status: AC
  Administered 2014-11-28: 60 mg via INTRAMUSCULAR
  Filled 2014-11-28: qty 2

## 2014-11-28 MED ORDER — CYCLOBENZAPRINE HCL 10 MG PO TABS: 10.0000 mg | ORAL_TABLET | Freq: Three times a day (TID) | ORAL | Status: DC | PRN

## 2014-11-28 NOTE — Discharge Instructions (Signed)
Read the information below.  Use the prescribed medication as directed.  Please discuss all new medications with your pharmacist.  You may return to the Emergency Department at any time for worsening condition or any new symptoms that concern you.     If you develop fevers, loss of control of bowel or bladder, weakness or numbness in your legs, or are unable to walk, return to the ER for a recheck.    Back Pain, Adult Back pain is very common in adults.The cause of back pain is rarely dangerous and the pain often gets better over time.The cause of your back pain may not be known. Some common causes of back pain include:  Strain of the muscles or ligaments supporting the spine.  Wear and tear (degeneration) of the spinal disks.  Arthritis.  Direct injury to the back. For many people, back pain may return. Since back pain is rarely dangerous, most people can learn to manage this condition on their own. HOME CARE INSTRUCTIONS Watch your back pain for any changes. The following actions may help to lessen any discomfort you are feeling:  Remain active. It is stressful on your back to sit or stand in one place for long periods of time. Do not sit, drive, or stand in one place for more than 30 minutes at a time. Take short walks on even surfaces as soon as you are able.Try to increase the length of time you walk each day.  Exercise regularly as directed by your health care provider. Exercise helps your back heal faster. It also helps avoid future injury by keeping your muscles strong and flexible.  Do not stay in bed.Resting more than 1-2 days can delay your recovery.  Pay attention to your body when you bend and lift. The most comfortable positions are those that put less stress on your recovering back. Always use proper lifting techniques, including:  Bending your knees.  Keeping the load close to your body.  Avoiding twisting.  Find a comfortable position to sleep. Use a firm mattress  and lie on your side with your knees slightly bent. If you lie on your back, put a pillow under your knees.  Avoid feeling anxious or stressed.Stress increases muscle tension and can worsen back pain.It is important to recognize when you are anxious or stressed and learn ways to manage it, such as with exercise.  Take medicines only as directed by your health care provider. Over-the-counter medicines to reduce pain and inflammation are often the most helpful.Your health care provider may prescribe muscle relaxant drugs.These medicines help dull your pain so you can more quickly return to your normal activities and healthy exercise.  Apply ice to the injured area:  Put ice in a plastic bag.  Place a towel between your skin and the bag.  Leave the ice on for 20 minutes, 2-3 times a day for the first 2-3 days. After that, ice and heat may be alternated to reduce pain and spasms.  Maintain a healthy weight. Excess weight puts extra stress on your back and makes it difficult to maintain good posture. SEEK MEDICAL CARE IF:  You have pain that is not relieved with rest or medicine.  You have increasing pain going down into the legs or buttocks.  You have pain that does not improve in one week.  You have night pain.  You lose weight.  You have a fever or chills. SEEK IMMEDIATE MEDICAL CARE IF:   You develop new bowel or bladder  control problems.  You have unusual weakness or numbness in your arms or legs.  You develop nausea or vomiting.  You develop abdominal pain.  You feel faint.   This information is not intended to replace advice given to you by your health care provider. Make sure you discuss any questions you have with your health care provider.   Document Released: 12/22/2004 Document Revised: 01/12/2014 Document Reviewed: 04/25/2013 Elsevier Interactive Patient Education 2016 [employer]    Emergency Department Resource Guide 1) Find a Doctor and Pay Out of  Pocket Although you won't have to find out who is covered by your insurance plan, it is a good idea to ask around and get recommendations. You will then need to call the office and see if the doctor you have chosen will accept you as a new patient and what types of options they offer for patients who are self-pay. Some doctors offer discounts or will set up payment plans for their patients who do not have insurance, but you will need to ask so you aren't surprised when you get to your appointment.  2) Contact Your Local Health Department Not all health departments have doctors that can see patients for sick visits, but many do, so it is worth a call to see if yours does. If you don't know where your local health department is, you can check in your phone book. The CDC also has a tool to help you locate your state's health department, and many state websites also have listings of all of their local health departments.  3) Find a Walk-in Clinic If your illness is not likely to be very severe or complicated, you may want to try a walk in clinic. These are popping up all over the country in pharmacies, drugstores, and shopping centers. They're usually staffed by nurse practitioners or physician assistants that have been trained to treat common illnesses and complaints. They're usually fairly quick and inexpensive. However, if you have serious medical issues or chronic medical problems, these are probably not your best option.  No Primary Care Doctor: - Call Health Connect at  [phone] - they can help you locate a primary care doctor that  accepts your insurance, provides certain services, etc. - Physician Referral Service- [phone]  Chronic Pain Problems: Organization         Address  Phone   Notes  [facility]  [phone] Patients need to be referred by their primary care doctor.   Medication Assistance: Organization         Address  Phone   Notes  [facility]  Medication [facility] [address] [place], [place] [address] [phone] --Must be a resident of [place] -- Must have NO insurance coverage whatsoever (no Medicaid/ Medicare, etc.) -- The pt. MUST have a primary care doctor that directs their care regularly and follows them in the community   MedAssist  [phone]   [employer]  [phone]    Agencies that provide inexpensive medical care: Organization         Address  Phone   Notes  [facility]  [phone]   [facility]    [phone]   [facility] [address] [place], [place] [address] [phone]) [phone]   [facility] [address], [place] [phone]   [facility]anned Parenthood    [phone]   [facility]    [phone]   [address], [place] Phone:  [phone], Fax:  [phone] Hours of Operation:  9 am - 6 pm, M-F.  Also accepts Medicaid/Medicare and self-pay.  [facility] for [address], [address], [place] Phone: [phone], Fax: [phone]. Hours of Operation:  8:30 am - 5:30 pm, M-F.  Also accepts Medicaid and self-pay.  [facility] [address], [place] Phone: [phone]   [place], [place], [place] [phone], Ext. [phone] Mondays & Thursdays: 7-9 AM.  First 15 patients are seen on a first come, first serve basis.    [place] Providers:  Organization         Address  Phone   Notes  [facility] [address], [place] [phone] Also accepts self-pay patients.  [facility] [address], [place]  [phone]   [place], [address], [place] [phone]   [facility] [address], [place] [phone]   Lucianne Lei [address], [place]   [phone] Only accepts [place] Access [place] patients after they have their name applied to their card.   Self-Pay (no insurance) in [facility]:  Organization         Address  Phone   Notes  Sickle Cell Patients, [facility] [place] [phone]   [facility] [place] [phone]   [facility] [place]  [place], [place], [place] [phone]   [facility]/Dr. Osei-Bonsu  [address], [place] or [address], [place] [phone] Phone number for both [place] and [place] locations is the same.  [facility] [address], [place] [phone]   [facility] [address] [place] or [address] [phone]   [facility] [address] [place] [phone], phone; [phone], fax Sees patients 1st and 3rd Saturday of every month.  Must not qualify for public or private insurance (i.e. Medicaid, Medicare, [place] Health Choice, Veterans' Benefits)  Household income should be no more than 200% of the poverty level The clinic cannot treat you if you are pregnant or think you are pregnant  Sexually transmitted diseases are not treated at the clinic.    Dental Care: Organization         Address  Phone  Notes  [facility] [place] [phone] Accepts children up to age 89 who are enrolled in [place] or [place]; pregnant women with a Medicaid card; and children who have applied for Medicaid or [place] Health Choice, but were declined, whose parents can pay a reduced fee at time of service.  [facility]  [address], [place]  [phone] Accepts children up to age 65 who are enrolled in [place] or [place]; pregnant women with a Medicaid card; and children who have applied for Medicaid or [place] Health Choice, but were declined, whose parents can pay a reduced fee at time of service.  [place]  Access PROGRAM  [place] [phone] Patients are seen by appointment only. Walk-ins are not accepted. [place] will see patients 16 years of age and older. Monday - Tuesday (8am-5pm) Most Wednesdays (8:30-5pm) $30 per visit, cash only  [facility] Adult Dental Access PROGRAM  [address], [facility] [phone] Patients are seen by appointment only. Walk-ins are not accepted. [place] will see patients 36 years of age and older. One Wednesday Evening (Monthly: Volunteer Based).  $30 per visit, cash only  [place]  [phone] for adults; Children under age 32, call Graduate Pediatric Dentistry at [phone]. Children aged 60-14, please call [phone] to request a pediatric application.  Dental services are provided in all areas of dental care including fillings, crowns and bridges, complete and partial dentures, implants, gum treatment, root canals, and extractions. Preventive care is also provided. Treatment is provided to both adults and children. Patients are selected via a lottery and there is often a waiting list.   [facility] [address] [place]  [phone] [email or web address]   [facility] [address] [place], [place] [phone], Ext. [phone] Second and Fourth Thursday of each month, opens at 6:30 AM; Clinic ends at 9 AM.  Patients are seen on a first-come first-served basis, and a limited number are seen during each clinic.   [facility]  [address] Hillard Danker [place], [place] [phone]   Eligibility Requirements You must have lived in [place], [place], or [place]  counties for at least the last three months.   You cannot be eligible for state or federal sponsored [employer], including [employer], [place], or [employer].   You generally cannot be eligible for healthcare insurance through your employer.    How to apply: Eligibility screenings are held every Tuesday and Wednesday afternoon from 1:00 pm until 4:00 pm. You do not need an appointment for the interview!  [facility] [address] [place], [place]   [place]  [place] Department  [place]  [phone]    Behavioral Health Resources in the Community: Intensive Outpatient Programs Organization         Address  Phone  Notes  [place] [address], [place], [place] [phone]   [facility] Outpatient [address], [place], [place]   ADS: Alcohol & Drug Svcs [address], [place], [place]   [place] [address],  [place], [place] or [phone]   Substance Abuse Resources Organization         Address  Phone  Notes  Alcohol and Drug Services  [phone]   [place]  [phone]   [facility] [place]   Chinita Pester  [phone]   Residential & Outpatient Substance Abuse Program  [phone]   Psychological Services Organization         Address  Phone  Notes  [facility]  [place]  [phone]   [place] [address], [place] or [phone]    Mobile Crisis Teams Organization         Address  Phone  Notes  [facility], Mobile Crisis Care Unit  [phone]   [facility]  [address] [place], [place]  [place], [place] [phone]    Self-Help/Support Groups Organization         Address  Phone             Notes  [facility] [place] - variety of support groups  [place] Call for more information  Narcotics Anonymous (NA), [facility] [address], [employer] [place]  2 meetings at this location   Special educational needs teacher         Address  Phone  Notes  [facility] Residential Treatment [place],    [place]  [phone]   [facility]  [address] [place] [phone], [place], [place]   [place], [place] [phone] Admissions: 8am-3pm M-F  Incentives Substance [place] [address]    [place], [place] [phone]   [facility] [address] [place], [place], [place]   [facility] [address],  [place], [place]   [facility] Programs - Intensive Outpatient [address], [place], [place]   [facility] ([place].,  [place], [place] [phone] or [phone]   [facility] (RTS) [address] [place], [place] Accepts Medicaid  [facility] [place] [address]  [place] [phone] Substance Abuse/Addiction Treatment   [facility] Organization         Address  Phone  Notes  [facility]  [phone]   Domenic Schwab, PhD [address] Arlis [place], [place]   [phone] or [phone]   [facility] [place], [place] [phone]   [facility] [address] [place], [place] [phone] Insurance/Medicaid/sponsorship through [facility] and Families [address] [place]                                    [place], [place] [phone] [place] [address], [place] [phone]    Dr. Adele Schilder  [phone]   [facility] Dept. 1) [address], [place] 2) [place] 3)  [place] [address], [place] [phone]  [phone]   [place] [phone] or [phone] (After Hours)
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
## 2014-11-28 NOTE — ED Notes (Signed)
See pa assessmebnt
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
## 2015-02-17 ENCOUNTER — Encounter (HOSPITAL_COMMUNITY): Payer: Self-pay | Admitting: Emergency Medicine

## 2015-02-17 ENCOUNTER — Emergency Department (HOSPITAL_COMMUNITY)
Admission: EM | Admit: 2015-02-17 | Discharge: 2015-02-17 | Disposition: A | Discharge status: Home / Self Care | Payer: Self-pay | Attending: Emergency Medicine | Admitting: Emergency Medicine

## 2015-02-17 DIAGNOSIS — I10 Essential (primary) hypertension: Secondary | ICD-10-CM | POA: Insufficient documentation

## 2015-02-17 DIAGNOSIS — IMO0002 Reserved for concepts with insufficient information to code with codable children: Secondary | ICD-10-CM

## 2015-02-17 DIAGNOSIS — F419 Anxiety disorder, unspecified: Secondary | ICD-10-CM | POA: Insufficient documentation

## 2015-02-17 DIAGNOSIS — Z23 Encounter for immunization: Secondary | ICD-10-CM | POA: Insufficient documentation

## 2015-02-17 DIAGNOSIS — W260XXA Contact with knife, initial encounter: Secondary | ICD-10-CM | POA: Insufficient documentation

## 2015-02-17 DIAGNOSIS — Z792 Long term (current) use of antibiotics: Secondary | ICD-10-CM | POA: Insufficient documentation

## 2015-02-17 DIAGNOSIS — S61211A Laceration without foreign body of left index finger without damage to nail, initial encounter: Secondary | ICD-10-CM | POA: Insufficient documentation

## 2015-02-17 DIAGNOSIS — Y990 Civilian activity done for income or pay: Secondary | ICD-10-CM | POA: Insufficient documentation

## 2015-02-17 DIAGNOSIS — F17200 Nicotine dependence, unspecified, uncomplicated: Secondary | ICD-10-CM | POA: Insufficient documentation

## 2015-02-17 DIAGNOSIS — Y93G9 Activity, other involving cooking and grilling: Secondary | ICD-10-CM | POA: Insufficient documentation

## 2015-02-17 DIAGNOSIS — Y92511 Restaurant or cafe as the place of occurrence of the external cause: Secondary | ICD-10-CM | POA: Insufficient documentation

## 2015-02-17 MED ORDER — TETANUS-DIPHTH-ACELL PERTUSSIS 5-2.5-18.5 LF-MCG/0.5 IM SUSP
0.5000 mL | Freq: Once | INTRAMUSCULAR | Status: AC
  Administered 2015-02-17: 0.5 mL via INTRAMUSCULAR
  Filled 2015-02-17: qty 0.5

## 2015-02-17 MED ORDER — LIDOCAINE HCL (PF) 1 % IJ SOLN
5.0000 mL | Freq: Once | INTRAMUSCULAR | Status: AC
  Administered 2015-02-17: 5 mL via INTRADERMAL
  Filled 2015-02-17: qty 5
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
## 2015-02-17 NOTE — ED Notes (Signed)
Lac to left finger with a knife while cutting chicken- works at [employer]--- bleeding controlled.

## 2015-02-17 NOTE — Discharge Instructions (Signed)
Please read and follow all provided instructions.  Your diagnoses today include:  1. Laceration    Tests performed today include:  X-ray of the affected area that did not show any foreign bodies or broken bones  Vital signs. See below for your results today.   Medications prescribed:   Take any prescribed medications only as directed.   Home care instructions:  Follow any educational materials and wound care instructions contained in this packet.   You may shower and wash the area with soap and water, just be sure to pat the area dry and not rub over the stitches. Do no put your stiches underwater (in a bath, pool, or lake). Getting stiches wet can slow down healing and increase your chances of getting an infection. You may apply Bacitracin or Neosporin twice a day for 7 days, and keep the ara clean with  bandage or gauze. Do not apply alcohol or hydrogen peroxide. Cover the area if it draining or weeping.   Follow-up instructions: Suture Removal: Return to the Emergency Department or see your primary care care doctor in 8-10 days for a recheck of your wound and removal of your sutures or staples.    Return instructions:  Return to the Emergency Department if you have:  Fever  Worsening pain  Worsening swelling of the wound  Pus draining from the wound  Redness of the skin that moves away from the wound, especially if it streaks away from the affected area   Any other emergent concerns  Your vital signs today were: BP 153/88 mmHg   Pulse 73   Temp(Src) 98 F (36.7 C) (Oral)   Resp 20   SpO2 98% If your blood pressure (BP) was elevated above 135/85 this visit, please have this repeated by your doctor within one month. --------------

## 2015-04-30 ENCOUNTER — Emergency Department (HOSPITAL_COMMUNITY): Payer: Self-pay

## 2015-04-30 ENCOUNTER — Encounter (HOSPITAL_COMMUNITY): Payer: Self-pay | Admitting: Emergency Medicine

## 2015-04-30 ENCOUNTER — Emergency Department (HOSPITAL_COMMUNITY)
Admission: EM | Admit: 2015-04-30 | Discharge: 2015-04-30 | Disposition: A | Discharge status: Home / Self Care | Payer: Self-pay | Attending: Emergency Medicine | Admitting: Emergency Medicine

## 2015-04-30 DIAGNOSIS — Z792 Long term (current) use of antibiotics: Secondary | ICD-10-CM | POA: Insufficient documentation

## 2015-04-30 DIAGNOSIS — Y9389 Activity, other specified: Secondary | ICD-10-CM | POA: Insufficient documentation

## 2015-04-30 DIAGNOSIS — S6991XA Unspecified injury of right wrist, hand and finger(s), initial encounter: Secondary | ICD-10-CM

## 2015-04-30 DIAGNOSIS — Z8659 Personal history of other mental and behavioral disorders: Secondary | ICD-10-CM | POA: Insufficient documentation

## 2015-04-30 DIAGNOSIS — Y9289 Other specified places as the place of occurrence of the external cause: Secondary | ICD-10-CM | POA: Insufficient documentation

## 2015-04-30 DIAGNOSIS — I10 Essential (primary) hypertension: Secondary | ICD-10-CM | POA: Insufficient documentation

## 2015-04-30 DIAGNOSIS — Y998 Other external cause status: Secondary | ICD-10-CM | POA: Insufficient documentation

## 2015-04-30 DIAGNOSIS — S60511A Abrasion of right hand, initial encounter: Secondary | ICD-10-CM | POA: Insufficient documentation

## 2015-04-30 DIAGNOSIS — F17210 Nicotine dependence, cigarettes, uncomplicated: Secondary | ICD-10-CM | POA: Insufficient documentation

## 2015-04-30 MED ORDER — IBUPROFEN 800 MG PO TABS: 800.0000 mg | ORAL_TABLET | Freq: Three times a day (TID) | ORAL | Status: DC

## 2015-04-30 MED ORDER — AMOXICILLIN-POT CLAVULANATE 875-125 MG PO TABS: 1.0000 | ORAL_TABLET | Freq: Two times a day (BID) | ORAL | Status: AC

## 2015-04-30 MED ORDER — IBUPROFEN 400 MG PO TABS
800.0000 mg | ORAL_TABLET | Freq: Once | ORAL | Status: AC
  Administered 2015-04-30: 800 mg via ORAL
  Filled 2015-04-30: qty 2

## 2015-04-30 NOTE — Discharge Instructions (Signed)
Take your medications as prescribed. I also recommend continuing to rest, elevate and ice her right hand for 15-20 minutes 3-4 times daily to help with pain and swelling. Keep wound clean using antibacterial soap (Dial) and water daily and pat dry. You may also apply a small amount of antibacterial ointment daily. Please follow up with a primary care provider from the Resource Guide provided below in 1 week as needed. Please return to the Emergency Department if symptoms worsen or new onset of fever, redness, swelling, warmth, drainage, numbness, tingling, weakness.
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
## 2015-04-30 NOTE — ED Notes (Signed)
Applied ice to pt right hand
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
## 2015-05-06 ENCOUNTER — Emergency Department (HOSPITAL_COMMUNITY): Payer: Self-pay

## 2015-05-06 ENCOUNTER — Emergency Department (HOSPITAL_COMMUNITY)
Admission: EM | Admit: 2015-05-06 | Discharge: 2015-05-07 | Disposition: A | Discharge status: Home / Self Care | Payer: Self-pay | Attending: Emergency Medicine | Admitting: Emergency Medicine

## 2015-05-06 DIAGNOSIS — W25XXXA Contact with sharp glass, initial encounter: Secondary | ICD-10-CM | POA: Insufficient documentation

## 2015-05-06 DIAGNOSIS — Y9289 Other specified places as the place of occurrence of the external cause: Secondary | ICD-10-CM | POA: Insufficient documentation

## 2015-05-06 DIAGNOSIS — S61511A Laceration without foreign body of right wrist, initial encounter: Secondary | ICD-10-CM

## 2015-05-06 DIAGNOSIS — Z8659 Personal history of other mental and behavioral disorders: Secondary | ICD-10-CM | POA: Insufficient documentation

## 2015-05-06 DIAGNOSIS — F17210 Nicotine dependence, cigarettes, uncomplicated: Secondary | ICD-10-CM | POA: Insufficient documentation

## 2015-05-06 DIAGNOSIS — Y9389 Activity, other specified: Secondary | ICD-10-CM | POA: Insufficient documentation

## 2015-05-06 DIAGNOSIS — I10 Essential (primary) hypertension: Secondary | ICD-10-CM | POA: Insufficient documentation

## 2015-05-06 DIAGNOSIS — Z792 Long term (current) use of antibiotics: Secondary | ICD-10-CM | POA: Insufficient documentation

## 2015-05-06 DIAGNOSIS — Y998 Other external cause status: Secondary | ICD-10-CM | POA: Insufficient documentation

## 2015-05-06 DIAGNOSIS — Z791 Long term (current) use of non-steroidal anti-inflammatories (NSAID): Secondary | ICD-10-CM | POA: Insufficient documentation

## 2015-05-06 DIAGNOSIS — IMO0002 Reserved for concepts with insufficient information to code with codable children: Secondary | ICD-10-CM

## 2015-05-06 DIAGNOSIS — S66921A Laceration of unspecified muscle, fascia and tendon at wrist and hand level, right hand, initial encounter: Secondary | ICD-10-CM | POA: Insufficient documentation

## 2015-05-06 HISTORY — DX: Laceration without foreign body of right wrist, initial encounter: S61.511A

## 2015-05-06 NOTE — ED Notes (Addendum)
Pt states that he cut his R wrist tonight after putting his arm through a [place] cabinet. Was seen by EMS but didn't want to ride with them. States he saw tendons/ligaments. Last tetanus shot 3 weeks ago. Alert and oriented.

## 2015-05-07 ENCOUNTER — Telehealth (HOSPITAL_BASED_OUTPATIENT_CLINIC_OR_DEPARTMENT_OTHER): Payer: Self-pay | Admitting: Emergency Medicine

## 2015-05-07 ENCOUNTER — Other Ambulatory Visit: Payer: Self-pay | Admitting: Orthopedic Surgery

## 2015-05-07 MED ORDER — LIDOCAINE HCL (PF) 1 % IJ SOLN
5.0000 mL | Freq: Once | INTRAMUSCULAR | Status: AC
  Administered 2015-05-07: 5 mL via INTRADERMAL
  Filled 2015-05-07: qty 5

## 2015-05-07 MED ORDER — OXYCODONE-ACETAMINOPHEN 5-325 MG PO TABS: 1.0000 | ORAL_TABLET | Freq: Four times a day (QID) | ORAL | Status: DC | PRN

## 2015-05-07 MED ORDER — OXYCODONE-ACETAMINOPHEN 5-325 MG PO TABS
2.0000 | ORAL_TABLET | Freq: Once | ORAL | Status: AC
  Administered 2015-05-07: 2 via ORAL
  Filled 2015-05-07: qty 2

## 2015-05-07 NOTE — ED Provider Notes (Signed)
CSN: [id number]     Arrival date & time 05/06/15  2300 History  By signing my name below, I, [facility], attest that this documentation has been prepared under the direction and in the presence of Geoffery Lyonsouglas Donnivan Villena, MD. Electronically Signed: Randell PatientMarrissa Washington, ED Scribe. 05/07/2015. 1:13 AM.   Chief Complaint  Patient presents with  . Extremity Laceration    The history is provided by the patient. No language interpreter was used.   HPI Comments: Daniel Spence is a 25 y.o. male with an hx of HTN and anxiety who presents to the Emergency Department complaining of actively bleeding, small, moderately painful right wrist laceration onset earlier tonight. Pt states he was moving a heavy [place] cabinet when he lost his grip and broke the glass with his right hand, lacerating it and followed immediately by pain. He reports gradually worsening numbness that radiates down into his finger and up to his elbow, reduced ROM secondary to pain, and that he saw tendons and ligaments through the laceration since this injury. Pain is worse with movement and palpation. Pt states that he is otherwise healthy. Tetanus UTD. Denies any other symptoms currently.  Past Medical History  Diagnosis Date  . Hypertension   . Anxiety    No past surgical history on file. No family history on file. Social History  Substance Use Topics  . Smoking status: Current Every Day Smoker -- 0.25 packs/day    Types: Cigarettes  . Smokeless tobacco: Not on file  . Alcohol Use: Yes     Comment: occ    Review of Systems  Musculoskeletal: Positive for myalgias.  Skin: Positive for wound.  Neurological: Positive for numbness.  All other systems reviewed and are negative.  Allergies  Review of patient's allergies indicates no known allergies.  Home Medications   Prior to Admission medications   Medication Sig Start Date End Date Taking? Authorizing Provider  amoxicillin (AMOXIL) 500 MG capsule Take 1 capsule (500 mg  total) by mouth 3 (three) times daily. 07/10/14   Hope Orlene OchM Neese, NP  cyclobenzaprine (FLEXERIL) 10 MG tablet Take 1 tablet (10 mg total) by mouth 3 (three) times daily as needed for muscle spasms (or pain). 11/28/14   [name], PA-C  ibuprofen (ADVIL,MOTRIN) 800 MG tablet Take 1 tablet (800 mg total) by mouth 3 (three) times daily. 04/30/15   Satira SarkNicole Elizabeth Nadeau, PA-C   BP 161/79 mmHg  Pulse 78  Temp(Src) 98.3 F (36.8 C) (Oral)  Resp 18  SpO2 98% Physical Exam  Constitutional: He is oriented to person, place, and time. He appears well-developed and well-nourished.  HENT:  Head: Normocephalic.  Eyes: EOM are normal.  Neck: Normal range of motion.  Pulmonary/Chest: Effort normal.  Abdominal: He exhibits no distension.  Musculoskeletal: Normal range of motion.  There is a 3 cm to the medial aspect of his right distal forearm. There is a tendon visible with at least a nearly full disruption visible. He has severe pain with an ROM which limits exam somewhat.  Neurological: He is alert and oriented to person, place, and time.  Psychiatric: He has a normal mood and affect.  Nursing note and vitals reviewed.   ED Course  Procedures   DIAGNOSTIC STUDIES: Oxygen Saturation is 98% on RA, normal by my interpretation.    COORDINATION OF CARE: 12:23 AM Discussed results of right wrist imaging. Will consult with hand specialist. Discussed treatment plan with pt at bedside and pt agreed to plan.  12:48 AM Consulted with  Dr. Mina Marble. Will return to perform laceration repair. Will provide pt with referral to follow-up with Dr. Mina Marble in the morning.  1:09 AM Returned to perform laceration repair.    Imaging Review Dg Wrist Complete Right  05/06/2015  CLINICAL DATA:  Pain and laceration transversely across the anterior surface of the wrist after moving in [place] cabinet. Cut wrist with broken glass. EXAM: RIGHT WRIST - COMPLETE 3+ VIEW COMPARISON:  04/30/2015 FINDINGS: Right wrist appears  intact. No evidence of acute fracture or subluxation. No focal bone lesion or bone destruction. Bone cortex and trabecular architecture appear intact. No radiopaque soft tissue foreign bodies. Bandage material over a laceration in the radial aspect of the anterior wrist. IMPRESSION: No acute bony abnormalities. No radiopaque soft tissue foreign bodies. Electronically Signed   By: Burman Nieves M.D.   On: 05/06/2015 23:30   I have personally reviewed and evaluated these images as part of my medical decision-making.  LACERATION REPAIR Performed by: Geoffery Lyons Authorized by: Geoffery Lyons Consent: Verbal consent obtained. Risks and benefits: risks, benefits and alternatives were discussed Consent given by: patient Patient identity confirmed: provided demographic data Prepped and Draped in normal sterile fashion Wound explored  Laceration Location: Right wrist  Laceration Length: 2.5 cm  No Foreign Bodies seen or palpated  Anesthesia: local infiltration  Local anesthetic: lidocaine 1 % without epinephrine  Anesthetic total: 3 ml  Irrigation method: syringe Amount of cleaning: standard  Skin closure: 4-0 Prolene   Number of sutures: 3   Technique: Simple interrupted   Patient tolerance: Patient tolerated the procedure well with no immediate complications.   MDM   Final diagnoses:  None   Laceration and tendon laceration was discussed with Dr. Mina Marble from hand surgery. He will see the patient in the office today. He is recommending temporary closure of the laceration. The patient will be discharged with pain medication, splinting, and follow-up with Dr. Mina Marble as scheduled.   I personally performed the services described in this documentation, which was scribed in my presence. The recorded information has been reviewed and is accurate.      Geoffery Lyons, MD 05/07/15 [phone]

## 2015-05-07 NOTE — Discharge Instructions (Signed)
Follow-up with Dr. Mina MarbleWeingold today. Call his office to arrange a time for this appointment. He is aware of your injury and situation.  Wear the splint that has been applied until then.  Take Percocet as prescribed as needed for your pain.   Laceration Care, Adult A laceration is a cut that goes through all of the layers of the skin and into the tissue that is right under the skin. Some lacerations heal on their own. Others need to be closed with stitches (sutures), staples, skin adhesive strips, or skin glue. Proper laceration care minimizes the risk of infection and helps the laceration to heal better. HOW TO CARE FOR YOUR LACERATION If sutures or staples were used:  Keep the wound clean and dry.  If you were given a bandage (dressing), you should change it at least one time per day or as told by your health care provider. You should also change it if it becomes wet or dirty.  Keep the wound completely dry for the first 24 hours or as told by your health care provider. After that time, you may shower or bathe. However, make sure that the wound is not soaked in water until after the sutures or staples have been removed.  Clean the wound one time each day or as told by your health care provider:  Wash the wound with soap and water.  Rinse the wound with water to remove all soap.  Pat the wound dry with a clean towel. Do not rub the wound.  After cleaning the wound, apply a thin layer of antibiotic ointmentas told by your health care provider. This will help to prevent infection and keep the dressing from sticking to the wound.  Have the sutures or staples removed as told by your health care provider. If skin adhesive strips were used:  Keep the wound clean and dry.  If you were given a bandage (dressing), you should change it at least one time per day or as told by your health care provider. You should also change it if it becomes dirty or wet.  Do not get the skin adhesive strips  wet. You may shower or bathe, but be careful to keep the wound dry.  If the wound gets wet, pat it dry with a clean towel. Do not rub the wound.  Skin adhesive strips fall off on their own. You may trim the strips as the wound heals. Do not remove skin adhesive strips that are still stuck to the wound. They will fall off in time. If skin glue was used:  Try to keep the wound dry, but you may briefly wet it in the shower or bath. Do not soak the wound in water, such as by swimming.  After you have showered or bathed, gently pat the wound dry with a clean towel. Do not rub the wound.  Do not do any activities that will make you sweat heavily until the skin glue has fallen off on its own.  Do not apply liquid, cream, or ointment medicine to the wound while the skin glue is in place. Using those may loosen the film before the wound has healed.  If you were given a bandage (dressing), you should change it at least one time per day or as told by your health care provider. You should also change it if it becomes dirty or wet.  If a dressing is placed over the wound, be careful not to apply tape directly over the skin glue. Doing  that may cause the glue to be pulled off before the wound has healed.  Do not pick at the glue. The skin glue usually remains in place for 5-10 days, then it falls off of the skin. General Instructions  Take over-the-counter and prescription medicines only as told by your health care provider.  If you were prescribed an antibiotic medicine or ointment, take or apply it as told by your doctor. Do not stop using it even if your condition improves.  To help prevent scarring, make sure to cover your wound with sunscreen whenever you are outside after stitches are removed, after adhesive strips are removed, or when glue remains in place and the wound is healed. Make sure to wear a sunscreen of at least 30 SPF.  Do not scratch or pick at the wound.  Keep all follow-up visits  as told by your health care provider. This is important.  Check your wound every day for signs of infection. Watch for:  Redness, swelling, or pain.  Fluid, blood, or pus.  Raise (elevate) the injured area above the level of your heart while you are sitting or lying down, if possible. SEEK MEDICAL CARE IF:  You received a tetanus shot and you have swelling, severe pain, redness, or bleeding at the injection site.  You have a fever.  A wound that was closed breaks open.  You notice a bad smell coming from your wound or your dressing.  You notice something coming out of the wound, such as wood or glass.  Your pain is not controlled with medicine.  You have increased redness, swelling, or pain at the site of your wound.  You have fluid, blood, or pus coming from your wound.  You notice a change in the color of your skin near your wound.  You need to change the dressing frequently due to fluid, blood, or pus draining from the wound.  You develop a new rash.  You develop numbness around the wound. SEEK IMMEDIATE MEDICAL CARE IF:  You develop severe swelling around the wound.  Your pain suddenly increases and is severe.  You develop painful lumps near the wound or on skin that is anywhere on your body.  You have a red streak going away from your wound.  The wound is on your hand or foot and you cannot properly move a finger or toe.  The wound is on your hand or foot and you notice that your fingers or toes look pale or bluish.   This information is not intended to replace advice given to you by your health care provider. Make sure you discuss any questions you have with your health care provider.   Document Released: 12/22/2004 Document Revised: 05/08/2014 Document Reviewed: 12/18/2013 Elsevier Interactive Patient Education [employer]

## 2015-05-07 NOTE — ED Notes (Signed)
Pt reports understanding of discharge information. No questions at time of discharge 

## 2015-05-08 ENCOUNTER — Other Ambulatory Visit: Payer: Self-pay | Admitting: Orthopedic Surgery

## 2015-05-09 ENCOUNTER — Encounter (HOSPITAL_BASED_OUTPATIENT_CLINIC_OR_DEPARTMENT_OTHER): Payer: Self-pay | Admitting: *Deleted

## 2015-05-13 ENCOUNTER — Encounter (HOSPITAL_BASED_OUTPATIENT_CLINIC_OR_DEPARTMENT_OTHER)
Admission: RE | Disposition: A | Discharge status: Home / Self Care | Payer: Self-pay | Source: Ambulatory Visit | Attending: Orthopedic Surgery

## 2015-05-13 ENCOUNTER — Ambulatory Visit (HOSPITAL_BASED_OUTPATIENT_CLINIC_OR_DEPARTMENT_OTHER): Payer: Self-pay | Admitting: Anesthesiology

## 2015-05-13 ENCOUNTER — Ambulatory Visit (HOSPITAL_BASED_OUTPATIENT_CLINIC_OR_DEPARTMENT_OTHER)
Admission: RE | Admit: 2015-05-13 | Discharge: 2015-05-13 | Disposition: A | Discharge status: Home / Self Care | Payer: Self-pay | Source: Ambulatory Visit | Attending: Orthopedic Surgery | Admitting: Orthopedic Surgery

## 2015-05-13 ENCOUNTER — Encounter (HOSPITAL_BASED_OUTPATIENT_CLINIC_OR_DEPARTMENT_OTHER): Payer: Self-pay | Admitting: Anesthesiology

## 2015-05-13 DIAGNOSIS — F17210 Nicotine dependence, cigarettes, uncomplicated: Secondary | ICD-10-CM | POA: Insufficient documentation

## 2015-05-13 DIAGNOSIS — S61511A Laceration without foreign body of right wrist, initial encounter: Secondary | ICD-10-CM | POA: Insufficient documentation

## 2015-05-13 DIAGNOSIS — W25XXXA Contact with sharp glass, initial encounter: Secondary | ICD-10-CM | POA: Insufficient documentation

## 2015-05-13 DIAGNOSIS — I10 Essential (primary) hypertension: Secondary | ICD-10-CM | POA: Insufficient documentation

## 2015-05-13 HISTORY — DX: Laceration without foreign body of right wrist, initial encounter: S61.511A

## 2015-05-13 HISTORY — PX: TENDON REPAIR: SHX5111

## 2015-05-13 SURGERY — TENDON REPAIR
Anesthesia: Monitor Anesthesia Care | Site: Wrist | Laterality: Right

## 2015-05-13 MED ORDER — MIDAZOLAM HCL 5 MG/5ML IJ SOLN
INTRAMUSCULAR | Status: DC | PRN
  Administered 2015-05-13: 2 mg via INTRAVENOUS

## 2015-05-13 MED ORDER — PROPOFOL 10 MG/ML IV BOLUS
INTRAVENOUS | Status: AC
  Filled 2015-05-13: qty 40

## 2015-05-13 MED ORDER — OXYCODONE-ACETAMINOPHEN 5-325 MG PO TABS: 1.0000 | ORAL_TABLET | ORAL | Status: DC | PRN

## 2015-05-13 MED ORDER — FENTANYL CITRATE (PF) 100 MCG/2ML IJ SOLN
INTRAMUSCULAR | Status: AC
  Filled 2015-05-13: qty 2

## 2015-05-13 MED ORDER — PROPOFOL 500 MG/50ML IV EMUL
INTRAVENOUS | Status: DC | PRN
  Administered 2015-05-13: 25 ug/kg/min via INTRAVENOUS

## 2015-05-13 MED ORDER — BUPIVACAINE HCL (PF) 0.25 % IJ SOLN
INTRAMUSCULAR | Status: DC | PRN
  Administered 2015-05-13: 8 mL

## 2015-05-13 MED ORDER — FENTANYL CITRATE (PF) 100 MCG/2ML IJ SOLN
INTRAMUSCULAR | Status: DC | PRN
  Administered 2015-05-13: 100 ug via INTRAVENOUS

## 2015-05-13 MED ORDER — LIDOCAINE HCL (PF) 0.5 % IJ SOLN
INTRAMUSCULAR | Status: DC | PRN
  Administered 2015-05-13: 50 mL via INTRAVENOUS

## 2015-05-13 MED ORDER — OXYCODONE HCL 5 MG PO TABS
5.0000 mg | ORAL_TABLET | ORAL | Status: DC | PRN
  Administered 2015-05-13: 5 mg via ORAL

## 2015-05-13 MED ORDER — ONDANSETRON HCL 4 MG/2ML IJ SOLN
INTRAMUSCULAR | Status: AC
  Filled 2015-05-13: qty 2

## 2015-05-13 MED ORDER — BUPIVACAINE HCL (PF) 0.25 % IJ SOLN
INTRAMUSCULAR | Status: AC
  Filled 2015-05-13: qty 30

## 2015-05-13 MED ORDER — LIDOCAINE HCL (PF) 1 % IJ SOLN
INTRAMUSCULAR | Status: AC
  Filled 2015-05-13: qty 30

## 2015-05-13 MED ORDER — METOCLOPRAMIDE HCL 5 MG/ML IJ SOLN: 10.0000 mg | Freq: Once | INTRAMUSCULAR | Status: DC | PRN

## 2015-05-13 MED ORDER — KETOROLAC TROMETHAMINE 30 MG/ML IJ SOLN
INTRAMUSCULAR | Status: AC
  Filled 2015-05-13: qty 1

## 2015-05-13 MED ORDER — LIDOCAINE 2% (20 MG/ML) 5 ML SYRINGE
INTRAMUSCULAR | Status: AC
  Filled 2015-05-13: qty 5

## 2015-05-13 MED ORDER — LIDOCAINE HCL 2 % IJ SOLN
INTRAMUSCULAR | Status: AC
  Filled 2015-05-13: qty 20

## 2015-05-13 MED ORDER — CEFAZOLIN SODIUM-DEXTROSE 2-4 GM/100ML-% IV SOLN
2.0000 g | INTRAVENOUS | Status: AC
  Administered 2015-05-13: 2 g via INTRAVENOUS

## 2015-05-13 MED ORDER — GLYCOPYRROLATE 0.2 MG/ML IJ SOLN: 0.2000 mg | Freq: Once | INTRAMUSCULAR | Status: DC | PRN

## 2015-05-13 MED ORDER — MEPERIDINE HCL 25 MG/ML IJ SOLN: 6.2500 mg | INTRAMUSCULAR | Status: DC | PRN

## 2015-05-13 MED ORDER — SCOPOLAMINE 1 MG/3DAYS TD PT72: 1.0000 | MEDICATED_PATCH | Freq: Once | TRANSDERMAL | Status: DC | PRN

## 2015-05-13 MED ORDER — MIDAZOLAM HCL 2 MG/2ML IJ SOLN: 1.0000 mg | INTRAMUSCULAR | Status: DC | PRN

## 2015-05-13 MED ORDER — LACTATED RINGERS IV SOLN
INTRAVENOUS | Status: DC
  Administered 2015-05-13 (×2): via INTRAVENOUS

## 2015-05-13 MED ORDER — LACTATED RINGERS IV SOLN: INTRAVENOUS | Status: DC

## 2015-05-13 MED ORDER — DEXAMETHASONE SODIUM PHOSPHATE 10 MG/ML IJ SOLN
INTRAMUSCULAR | Status: AC
  Filled 2015-05-13: qty 1

## 2015-05-13 MED ORDER — CHLORHEXIDINE GLUCONATE 4 % EX LIQD: 60.0000 mL | Freq: Once | CUTANEOUS | Status: DC

## 2015-05-13 MED ORDER — FENTANYL CITRATE (PF) 100 MCG/2ML IJ SOLN
25.0000 ug | INTRAMUSCULAR | Status: DC | PRN
  Administered 2015-05-13 (×2): 50 ug via INTRAVENOUS

## 2015-05-13 MED ORDER — FENTANYL CITRATE (PF) 100 MCG/2ML IJ SOLN: 50.0000 ug | INTRAMUSCULAR | Status: DC | PRN

## 2015-05-13 MED ORDER — MIDAZOLAM HCL 2 MG/2ML IJ SOLN
INTRAMUSCULAR | Status: AC
  Filled 2015-05-13: qty 2

## 2015-05-13 MED ORDER — KETOROLAC TROMETHAMINE 30 MG/ML IJ SOLN
INTRAMUSCULAR | Status: DC | PRN
  Administered 2015-05-13: 30 mg via INTRAVENOUS

## 2015-05-13 MED ORDER — PROPOFOL 500 MG/50ML IV EMUL
INTRAVENOUS | Status: AC
  Filled 2015-05-13: qty 50

## 2015-05-13 MED ORDER — OXYCODONE HCL 5 MG PO TABS
ORAL_TABLET | ORAL | Status: AC
  Filled 2015-05-13: qty 1

## 2015-05-13 SURGICAL SUPPLY — 64 items
APL SKNCLS STERI-STRIP NONHPOA (GAUZE/BANDAGES/DRESSINGS)
BAG DECANTER FOR FLEXI CONT (MISCELLANEOUS) IMPLANT
BANDAGE ACE 4X5 VEL STRL LF (GAUZE/BANDAGES/DRESSINGS) IMPLANT
BENZOIN TINCTURE PRP APPL 2/3 (GAUZE/BANDAGES/DRESSINGS) IMPLANT
BLADE MINI RND TIP GREEN BEAV (BLADE) IMPLANT
BLADE SURG 15 STRL LF DISP TIS (BLADE) ×1 IMPLANT
BLADE SURG 15 STRL SS (BLADE) ×3
BNDG CMPR 9X4 STRL LF SNTH (GAUZE/BANDAGES/DRESSINGS)
BNDG COHESIVE 1X5 TAN STRL LF (GAUZE/BANDAGES/DRESSINGS) IMPLANT
BNDG ELASTIC 2X5.8 VLCR STR LF (GAUZE/BANDAGES/DRESSINGS) ×3 IMPLANT
BNDG ESMARK 4X9 LF (GAUZE/BANDAGES/DRESSINGS) IMPLANT
CLOSURE WOUND 1/2 X4 (GAUZE/BANDAGES/DRESSINGS)
CORDS BIPOLAR (ELECTRODE) ×3 IMPLANT
COVER BACK TABLE 60X90IN (DRAPES) ×3 IMPLANT
CUFF TOURNIQUET SINGLE 18IN (TOURNIQUET CUFF) IMPLANT
DECANTER SPIKE VIAL GLASS SM (MISCELLANEOUS) IMPLANT
DRAPE EXTREMITY T 121X128X90 (DRAPE) ×3 IMPLANT
DRAPE SURG 17X23 STRL (DRAPES) ×3 IMPLANT
DURAPREP 26ML APPLICATOR (WOUND CARE) ×3 IMPLANT
GAUZE SPONGE 4X4 12PLY STRL (GAUZE/BANDAGES/DRESSINGS) ×3 IMPLANT
GAUZE SPONGE 4X4 16PLY XRAY LF (GAUZE/BANDAGES/DRESSINGS) IMPLANT
GAUZE XEROFORM 1X8 LF (GAUZE/BANDAGES/DRESSINGS) IMPLANT
GLOVE BIOGEL PI IND STRL 7.0 (GLOVE) IMPLANT
GLOVE BIOGEL PI INDICATOR 7.0 (GLOVE) ×2
GLOVE ECLIPSE 6.5 STRL STRAW (GLOVE) ×2 IMPLANT
GLOVE SURG SYN 8.0 (GLOVE) ×6 IMPLANT
GLOVE SURG SYN 8.0 PF PI (GLOVE) ×2 IMPLANT
GOWN STRL REUS W/ TWL LRG LVL3 (GOWN DISPOSABLE) ×1 IMPLANT
GOWN STRL REUS W/TWL LRG LVL3 (GOWN DISPOSABLE) ×3
GOWN STRL REUS W/TWL XL LVL3 (GOWN DISPOSABLE) ×6 IMPLANT
IV LACTATED RINGERS 500ML (IV SOLUTION) IMPLANT
NDL HYPO 25X1 1.5 SAFETY (NEEDLE) ×1 IMPLANT
NEEDLE HYPO 22GX1.5 SAFETY (NEEDLE) IMPLANT
NEEDLE HYPO 25X1 1.5 SAFETY (NEEDLE) ×3 IMPLANT
NS IRRIG 1000ML POUR BTL (IV SOLUTION) ×3 IMPLANT
PACK BASIN DAY SURGERY FS (CUSTOM PROCEDURE TRAY) ×3 IMPLANT
PAD CAST 3X4 CTTN HI CHSV (CAST SUPPLIES) ×1 IMPLANT
PADDING CAST ABS 4INX4YD NS (CAST SUPPLIES) ×2
PADDING CAST ABS COTTON 4X4 ST (CAST SUPPLIES) ×1 IMPLANT
PADDING CAST COTTON 3X4 STRL (CAST SUPPLIES) ×3
PADDING UNDERCAST 2 STRL (CAST SUPPLIES)
PADDING UNDERCAST 2X4 STRL (CAST SUPPLIES) IMPLANT
PASSER SUT SWANSON 36MM LOOP (INSTRUMENTS) IMPLANT
SHEET MEDIUM DRAPE 40X70 STRL (DRAPES) ×3 IMPLANT
SPEAR EYE SURG WECK-CEL (MISCELLANEOUS) IMPLANT
SPLINT PLASTER CAST XFAST 4X15 (CAST SUPPLIES) IMPLANT
SPLINT PLASTER XTRA FAST SET 4 (CAST SUPPLIES) ×30
STOCKINETTE 4X48 STRL (DRAPES) ×3 IMPLANT
STRIP CLOSURE SKIN 1/2X4 (GAUZE/BANDAGES/DRESSINGS) IMPLANT
SUT ETHIBOND 3-0 V-5 (SUTURE) IMPLANT
SUT ETHILON 4 0 PS 2 18 (SUTURE) ×2 IMPLANT
SUT ETHILON 5 0 PS 2 18 (SUTURE) IMPLANT
SUT FIBERWIRE 3-0 18 TAPR NDL (SUTURE) ×3
SUT NYLON 9 0 VRM6 (SUTURE) IMPLANT
SUT PROLENE 3 0 PS 2 (SUTURE) IMPLANT
SUT PROLENE 6 0 P 1 18 (SUTURE) ×2 IMPLANT
SUT VICRYL RAPIDE 4-0 (SUTURE) IMPLANT
SUT VICRYL RAPIDE 4/0 PS 2 (SUTURE) IMPLANT
SUTURE FIBERWR 3-0 18 TAPR NDL (SUTURE) IMPLANT
SYR BULB 3OZ (MISCELLANEOUS) ×3 IMPLANT
SYR CONTROL 10ML LL (SYRINGE) IMPLANT
TOWEL OR 17X24 6PK STRL BLUE (TOWEL DISPOSABLE) ×3 IMPLANT
TUBE FEEDING 5FR 15 INCH (TUBING) IMPLANT
UNDERPAD 30X30 (UNDERPADS AND DIAPERS) ×3 IMPLANT

## 2015-05-13 NOTE — Discharge Instructions (Signed)

## 2015-05-13 NOTE — Anesthesia Postprocedure Evaluation (Signed)
Anesthesia Post Note  Patient: Daniel Spence  Procedure(s) Performed: Procedure(s) (LRB): EXPLORE, repair extensor tendons (Right)  Patient location during evaluation: PACU Anesthesia Type: Bier Block Level of consciousness: awake and alert Pain management: pain level controlled Vital Signs Assessment: post-procedure vital signs reviewed and stable Respiratory status: spontaneous breathing, nonlabored ventilation, respiratory function stable and patient connected to nasal cannula oxygen Cardiovascular status: blood pressure returned to baseline and stable Postop Assessment: no signs of nausea or vomiting Anesthetic complications: no    Last Vitals:  Filed Vitals:   05/13/15 1100 05/13/15 1115  BP: 138/70 127/67  Pulse: 71 78  Temp:    Resp: 18 16    Last Pain:  Filed Vitals:   05/13/15 1125  PainSc: 10-Worst pain ever                 Phillips Groutarignan, Mette Southgate

## 2015-05-13 NOTE — Transfer of Care (Signed)
Immediate Anesthesia Transfer of Care Note  Patient: Daniel Spence  Procedure(s) Performed: Procedure(s): EXPLORE, repair extensor tendons (Right)  Patient Location: PACU  Anesthesia Type:Bier block  Level of Consciousness: awake and patient cooperative  Airway & Oxygen Therapy: Patient Spontanous Breathing and Patient connected to face mask oxygen  Post-op Assessment: Report given to RN and Post -op Vital signs reviewed and stable  Post vital signs: Reviewed and stable  Last Vitals:  Filed Vitals:   05/13/15 0928  BP: 157/72  Pulse: 97  Temp: 37.1 C  Resp: 20    Last Pain:  Filed Vitals:   05/13/15 0930  PainSc: 4       Patients Stated Pain Goal: 1 (05/13/15 [id number])  Complications: No apparent anesthesia complications

## 2015-05-13 NOTE — Op Note (Signed)
See note [phone]

## 2015-05-13 NOTE — H&P (Signed)
Daniel Spence is an 25 y.o. male.   Chief Complaint: right wrist laceration with weakness HPI: as above s/p laceration to right wrist while moving glass cabinet  Past Medical History  Diagnosis Date  . Hypertension     states is always told his BP is "a little high", has never followed up with a PCP  . Laceration of wrist, right 05/06/2015    Past Surgical History  Procedure Laterality Date  . No past surgeries      History reviewed. No pertinent family history. Social History:  reports that he has been smoking Cigarettes.  He has been smoking about 0.00 packs per day for the past 9 years. He has never used smokeless tobacco. He reports that he drinks alcohol. He reports that he does not use illicit drugs.  Allergies: No Known Allergies  No prescriptions prior to admission    No results found for this or any previous visit (from the past 48 hour(s)). No results found.  Review of Systems  All other systems reviewed and are negative.   Height 6\' 4"  (1.93 m), weight 97.523 kg (215 lb). Physical Exam  Constitutional: He appears well-developed and well-nourished.  HENT:  Head: Normocephalic and atraumatic.  Neck: Normal range of motion.  Cardiovascular: Normal rate.   Respiratory: Effort normal.  Musculoskeletal:       Right wrist: He exhibits laceration.  Right wrist dorsoradial laceration with weakness in thumb/wrist motion  Skin: Skin is warm.  Psychiatric: He has a normal mood and affect. His behavior is normal. Judgment and thought content normal.     Assessment/Plan As above  Plan explore and repair as needed  Marlowe ShoresWEINGOLD,Sanya Kobrin A, MD 05/13/2015, 6:13 AM

## 2015-05-13 NOTE — Anesthesia Preprocedure Evaluation (Addendum)
Anesthesia Evaluation  Patient identified by MRN, date of birth, ID band Patient awake    Reviewed: Allergy & Precautions, NPO status , Patient's Chart, lab work & pertinent test results  Airway Mallampati: II  TM Distance: >3 FB Neck ROM: Full    Dental no notable dental hx.    Pulmonary Current Smoker,    Pulmonary exam normal breath sounds clear to auscultation       Cardiovascular hypertension, negative cardio ROS Normal cardiovascular exam Rhythm:Regular Rate:Normal     Neuro/Psych negative neurological ROS  negative psych ROS   GI/Hepatic negative GI ROS, Neg liver ROS,   Endo/Other  negative endocrine ROS  Renal/GU negative Renal ROS  negative genitourinary   Musculoskeletal negative musculoskeletal ROS (+)   Abdominal   Peds negative pediatric ROS (+)  Hematology negative hematology ROS (+)   Anesthesia Other Findings   Reproductive/Obstetrics negative OB ROS                            Anesthesia Physical Anesthesia Plan  ASA: II  Anesthesia Plan: MAC and Bier Block   Post-op Pain Management:    Induction: Intravenous  Airway Management Planned: Simple Face Mask  Additional Equipment:   Intra-op Plan:   Post-operative Plan: Extubation in OR  Informed Consent: I have reviewed the patients History and Physical, chart, labs and discussed the procedure including the risks, benefits and alternatives for the proposed anesthesia with the patient or authorized representative who has indicated his/her understanding and acceptance.   Dental advisory given  Plan Discussed with: CRNA  Anesthesia Plan Comments: (Pt drank coffee with cream at 6am. Bier block)        Anesthesia Quick Evaluation

## 2015-05-13 NOTE — Anesthesia Procedure Notes (Signed)
Procedure Name: MAC Date/Time: 05/13/2015 10:04 AM Performed by: [place] DesanctisLINKA, [place] Rabenold L Pre-anesthesia Checklist: Patient identified, Timeout performed, Emergency Drugs available, Suction available and Patient being monitored Patient Re-evaluated:Patient Re-evaluated prior to inductionOxygen Delivery Method: Simple face mask Preoxygenation: Pre-oxygenation with 100% oxygen Intubation Type: IV induction

## 2015-05-14 ENCOUNTER — Encounter (HOSPITAL_BASED_OUTPATIENT_CLINIC_OR_DEPARTMENT_OTHER): Payer: Self-pay | Admitting: Orthopedic Surgery

## 2015-05-14 NOTE — Op Note (Signed)
NAME:  Daniel Spence, Daniel Spence                  ACCOUNT NO.:  MEDICAL RECORD NO.:  [id number]  LOCATION:                                 FACILITY:  PHYSICIAN:  Barrie Wale A. Michaelann Gunnoe, M.D.DATE OF BIRTH:  July 19, 1990  DATE OF PROCEDURE:  05/13/2015 DATE OF DISCHARGE:                              OPERATIVE REPORT   PREOPERATIVE DIAGNOSIS:  Deep laceration, dorsal radial aspect, right wrist.  POSTOPERATIVE DIAGNOSIS:  Deep laceration, dorsal radial aspect, right wrist.  PROCEDURE:  Exploration of above with first dorsal compartment release and primary repair of abductor pollicis longus and extensor pollicis brevis tendons to right thumb.  SURGEON:  Artist PaisMatthew A. Mina MarbleWeingold, M.D.  ASSISTANT:  None.  ANESTHESIA:  Bier block.  COMPLICATIONS:  No complications.  DRAINS:  No drains.  DESCRIPTION OF PROCEDURE:  The patient was taken to the operating suite after induction of adequate IV sedation and Bier block analgesia.  Right upper extremity was prepped and draped in usual sterile fashion.  The transverse incision of the first dorsal compartment radial styloid area dorsal radially, was extended proximally and distally in gentle S fashion.  The flaps were raised and retracted with 4-0 nylon suture.  We carefully incised and released the first dorsal compartment to retrieve the proximal and distal aspects of the APL and EPB tendons.  We irrigated and removed clot.  We then repaired both the APL and EPB with 3-0 FiberWire, two horizontal mattress sutures for APL and one for EPB followed by 6-0 Prolene locked epitendinous-type stitches.  At the end of the procedure, the wound was irrigated one last time and closed loosely with 4-0 nylon.  Xeroform, 4x4s and a radial gutter splint was applied.  The patient tolerated all procedures well and went to the recovery room in stable fashion.     Artist PaisMatthew A. Mina MarbleWeingold, M.D.     MAW/MEDQ  D:  05/13/2015  T:  05/14/2015  Job:  [id number]

## 2015-06-23 ENCOUNTER — Emergency Department (HOSPITAL_COMMUNITY)
Admission: EM | Admit: 2015-06-23 | Discharge: 2015-06-23 | Disposition: A | Discharge status: Home / Self Care | Payer: Self-pay | Attending: Emergency Medicine | Admitting: Emergency Medicine

## 2015-06-23 ENCOUNTER — Emergency Department (HOSPITAL_COMMUNITY): Payer: Self-pay

## 2015-06-23 ENCOUNTER — Encounter (HOSPITAL_COMMUNITY): Payer: Self-pay

## 2015-06-23 DIAGNOSIS — F17210 Nicotine dependence, cigarettes, uncomplicated: Secondary | ICD-10-CM | POA: Insufficient documentation

## 2015-06-23 DIAGNOSIS — Y939 Activity, unspecified: Secondary | ICD-10-CM | POA: Insufficient documentation

## 2015-06-23 DIAGNOSIS — I10 Essential (primary) hypertension: Secondary | ICD-10-CM | POA: Insufficient documentation

## 2015-06-23 DIAGNOSIS — R0789 Other chest pain: Secondary | ICD-10-CM | POA: Insufficient documentation

## 2015-06-23 DIAGNOSIS — Y929 Unspecified place or not applicable: Secondary | ICD-10-CM | POA: Insufficient documentation

## 2015-06-23 DIAGNOSIS — Y9372 Activity, wrestling: Secondary | ICD-10-CM | POA: Insufficient documentation

## 2015-06-23 DIAGNOSIS — X58XXXA Exposure to other specified factors, initial encounter: Secondary | ICD-10-CM | POA: Insufficient documentation

## 2015-06-23 DIAGNOSIS — R0781 Pleurodynia: Secondary | ICD-10-CM

## 2015-06-23 NOTE — Discharge Instructions (Signed)
Chest Wall Pain Chest wall pain is pain in or around the bones and muscles of your chest. Sometimes, an injury causes this pain. Sometimes, the cause may not be known. This pain may take several weeks or longer to get better. HOME CARE Pay attention to any changes in your symptoms. Take these actions to help with your pain:  Rest as told by your doctor.  Avoid activities that cause pain. Try not to use your chest, belly (abdominal), or side muscles to lift heavy things.  If directed, apply ice to the painful area:  Put ice in a plastic bag.  Place a towel between your skin and the bag.  Leave the ice on for 20 minutes, 2-3 times per day.  Take over-the-counter and prescription medicines only as told by your doctor.  Do not use tobacco products, including cigarettes, chewing tobacco, and e-cigarettes. If you need help quitting, ask your doctor.  Keep all follow-up visits as told by your doctor. This is important. GET HELP IF:  You have a fever.  Your chest pain gets worse.  You have new symptoms. GET HELP RIGHT AWAY IF:  You feel sick to your stomach (nauseous) or you throw up (vomit).  You feel sweaty or light-headed.  You have a cough with phlegm (sputum) or you cough up blood.  You are short of breath.   This information is not intended to replace advice given to you by your health care provider. Make sure you discuss any questions you have with your health care provider.   Document Released: 06/10/2007 Document Revised: 09/12/2014 Document Reviewed: 03/19/2014 Elsevier Interactive Patient Education 2016 [employer]  Costochondritis Costochondritis, sometimes called Tietze syndrome, is a swelling and irritation (inflammation) of the tissue (cartilage) that connects your ribs with your breastbone (sternum). It causes pain in the chest and rib area. Costochondritis usually goes away on its own over time. It can take up to 6 weeks or longer to get better, especially if  you are unable to limit your activities. CAUSES  Some cases of costochondritis have no known cause. Possible causes include:  Injury (trauma).  Exercise or activity such as lifting.  Severe coughing. SIGNS AND SYMPTOMS  Pain and tenderness in the chest and rib area.  Pain that gets worse when coughing or taking deep breaths.  Pain that gets worse with specific movements. DIAGNOSIS  Your health care provider will do a physical exam and ask about your symptoms. Chest X-rays or other tests may be done to rule out other problems. TREATMENT  Costochondritis usually goes away on its own over time. Your health care provider may prescribe medicine to help relieve pain. HOME CARE INSTRUCTIONS   Avoid exhausting physical activity. Try not to strain your ribs during normal activity. This would include any activities using chest, abdominal, and side muscles, especially if heavy weights are used.  Apply ice to the affected area for the first 2 days after the pain begins.  Put ice in a plastic bag.  Place a towel between your skin and the bag.  Leave the ice on for 20 minutes, 2-3 times a day.  Only take over-the-counter or prescription medicines as directed by your health care provider. SEEK MEDICAL CARE IF:  You have redness or swelling at the rib joints. These are signs of infection.  Your pain does not go away despite rest or medicine. SEEK IMMEDIATE MEDICAL CARE IF:   Your pain increases or you are very uncomfortable.  You have shortness of  breath or difficulty breathing.  You cough up blood.  You have worse chest pains, sweating, or vomiting.  You have a fever or persistent symptoms for more than 2-3 days.  You have a fever and your symptoms suddenly get worse. MAKE SURE YOU:   Understand these instructions.  Will watch your condition.  Will get help right away if you are not doing well or get worse.   This information is not intended to replace advice given to you  by your health care provider. Make sure you discuss any questions you have with your health care provider.   Document Released: 10/01/2004 Document Revised: 10/12/2012 Document Reviewed: 07/26/2012 Elsevier Interactive Patient Education [employer]

## 2015-06-23 NOTE — ED Notes (Signed)
Patient complains of 3 days of sharp right sided chest pain after wrestling with his son. Pain with inspiration and movement

## 2015-06-23 NOTE — ED Provider Notes (Signed)
CSN: [id number]     Arrival date & time 06/23/15  1308 History  By signing my name below, I, Tanda Rockers, attest that this documentation has been prepared under the direction and in the presence of [employer], PA-C. Electronically Signed: Tanda Rockers, ED Scribe. 06/23/2015. 10:56 AM.   Chief Complaint  Patient presents with  . chest wall pain    The history is provided by the patient. No language interpreter was used.    HPI Comments: Daniel Spence is a 25 y.o. male with PMHx HTN, who presents to the Emergency Department complaining of sudden onset, constant, right sided chest wall pain that began 3 days ago, worsening today. Pt reports that he was wrestling with his son and his son's friends 3 days ago when he felt immediate mild pain to his chest. He did not think much of the pain at that time but reports it has gradually been worsening. Pt wrestled again with his son yesterday and believes he may have aggravated his chest even more. It is exacerbated with movement and deep breathing. Denies shortness of breath, cough, leg swelling, or any other associated symptoms.  No hx DVT/PE. Pt is current everyday smoker.   Past Medical History  Diagnosis Date  . Hypertension     states is always told his BP is "a little high", has never followed up with a PCP  . Laceration of wrist, right 05/06/2015   Past Surgical History  Procedure Laterality Date  . No past surgeries    . Tendon repair Right 05/13/2015    Procedure: EXPLORE, repair extensor tendons;  Surgeon: Dairl Ponder, MD;  Location: [facility];  Service: Orthopedics;  Laterality: Right;   No family history on file. Social History  Substance Use Topics  . Smoking status: Current Every Day Smoker -- 0.00 packs/day for 9 years    Types: Cigarettes  . Smokeless tobacco: Never Used     Comment: 1 pack/week  . Alcohol Use: Yes     Comment: rarely    Review of Systems  All other systems reviewed and are  negative.  Allergies  Review of patient's allergies indicates no known allergies.  Home Medications   Prior to Admission medications   Medication Sig Start Date End Date Taking? Authorizing Provider  oxyCODONE-acetaminophen (PERCOCET) 5-325 MG tablet Take 1-2 tablets by mouth every 6 (six) hours as needed. 05/07/15   Geoffery Lyons, MD  oxyCODONE-acetaminophen (ROXICET) 5-325 MG tablet Take 1 tablet by mouth every 4 (four) hours as needed for severe pain. 05/13/15   Dairl Ponder, MD   BP 136/83 mmHg  Pulse 58  Temp(Src) 97.8 F (36.6 C) (Oral)  Resp 18  SpO2 100%   Physical Exam  Constitutional: He is oriented to person, place, and time. He appears well-developed and well-nourished. No distress.  HENT:  Head: Normocephalic and atraumatic.  Eyes: Conjunctivae and EOM are normal.  Neck: Neck supple. No tracheal deviation present.  Cardiovascular: Normal rate.   Pulmonary/Chest: Effort normal and breath sounds normal. No respiratory distress. He has no wheezes. He has no rales. He exhibits tenderness.  TTP right anterior chest wall. No bruising.  Lung expansion normal.  Musculoskeletal: Normal range of motion.  Neurological: He is alert and oriented to person, place, and time.  Skin: Skin is warm and dry.  Psychiatric: He has a normal mood and affect. His behavior is normal.  Nursing note and vitals reviewed.   ED Course  Procedures (including critical care time)  DIAGNOSTIC STUDIES: Oxygen Saturation is 98% on RA, normal by my interpretation.    COORDINATION OF CARE: 10:53 AM-Discussed treatment plan which includes CXR with pt at bedside and pt agreed to plan.   Labs Review Labs Reviewed - No data to display  Imaging Review Dg Chest 2 View  06/23/2015  CLINICAL DATA:  Right-sided chest pain. EXAM: CHEST  2 VIEW COMPARISON:  10/19/2013 FINDINGS: Cardiomediastinal silhouette is normal. Mediastinal contours appear intact. There is no evidence of focal airspace  consolidation, pleural effusion or pneumothorax. Osseous structures are without acute abnormality. Soft tissues are grossly normal. IMPRESSION: No active cardiopulmonary disease. Electronically Signed   By: Ted Mcalpineobrinka  Dimitrova M.D.   On: 06/23/2015 11:17   I have personally reviewed and evaluated these images and lab results as part of my medical decision-making.   EKG Interpretation None      MDM   Final diagnoses:  Rib pain on right side   Labs: None  Imaging: DG Chest  Consults:  Therapeutics:  Discharge Meds:   Assessment/Plan:25 year old male presents today with likely musculoskeletal pain. Patient has no signs of pulmonary embolism, very low suspicion, tender upon palpation of the chest wall, worsened with movement. Lung sounds clear, no significant findings on chest x-ray. He'll be discharged home with instructions to stop wrestling with his son as this continues to aggravate the pain, and given strict return precautions. Ibuprofen for pain     I personally performed the services described in this documentation, which was scribed in my presence. The recorded information has been reviewed and is accurate.      Eyvonne MechanicJeffrey Jalayna Josten, PA-C 06/23/15 1137  Gerhard Munchobert Lockwood, MD 06/24/15 [phone]

## 2015-06-23 NOTE — ED Notes (Signed)
Declined W/C at D/C and was escorted to lobby by RN. 

## 2016-01-10 ENCOUNTER — Emergency Department (HOSPITAL_COMMUNITY)
Admission: EM | Admit: 2016-01-10 | Discharge: 2016-01-10 | Disposition: A | Discharge status: Home / Self Care | Payer: Self-pay | Attending: Emergency Medicine | Admitting: Emergency Medicine

## 2016-01-10 ENCOUNTER — Emergency Department (HOSPITAL_COMMUNITY): Payer: Self-pay

## 2016-01-10 ENCOUNTER — Encounter (HOSPITAL_COMMUNITY): Payer: Self-pay

## 2016-01-10 DIAGNOSIS — Y9389 Activity, other specified: Secondary | ICD-10-CM | POA: Insufficient documentation

## 2016-01-10 DIAGNOSIS — Y9289 Other specified places as the place of occurrence of the external cause: Secondary | ICD-10-CM | POA: Insufficient documentation

## 2016-01-10 DIAGNOSIS — I10 Essential (primary) hypertension: Secondary | ICD-10-CM | POA: Insufficient documentation

## 2016-01-10 DIAGNOSIS — F17210 Nicotine dependence, cigarettes, uncomplicated: Secondary | ICD-10-CM | POA: Insufficient documentation

## 2016-01-10 DIAGNOSIS — M25571 Pain in right ankle and joints of right foot: Secondary | ICD-10-CM | POA: Insufficient documentation

## 2016-01-10 DIAGNOSIS — X509XXA Other and unspecified overexertion or strenuous movements or postures, initial encounter: Secondary | ICD-10-CM | POA: Insufficient documentation

## 2016-01-10 DIAGNOSIS — Y999 Unspecified external cause status: Secondary | ICD-10-CM | POA: Insufficient documentation

## 2016-01-10 MED ORDER — NAPROXEN 500 MG PO TABS: 500.0000 mg | ORAL_TABLET | Freq: Two times a day (BID) | ORAL | 0 refills | Status: DC

## 2016-01-10 NOTE — ED Notes (Signed)
Patient states he slipped on some ice last pm and twisted his right ankle. Positive right pedal pulse. States he has increased pain with movement

## 2016-01-10 NOTE — ED Triage Notes (Signed)
Per Pt, Pt slipped on ice yesterday and reports pain to the right ankle. Pt reports being unable to move it. Pulses noted to be intact. Hx of HTN.

## 2016-01-10 NOTE — ED Notes (Signed)
Waiting on ortho for aso , patient aware

## 2016-01-10 NOTE — Progress Notes (Signed)
Orthopedic Tech Progress Note Patient Details:  Daniel Spence 19-Jun-1990 [id number]  Ortho Devices Type of Ortho Device: Crutches, ASO Ortho Device/Splint Location: applied ASo velcro ankle brace to right ankle/foot.  provided crutches for ankle. pt was able to use crutches well. tolerated well.  Ortho Device/Splint Interventions: Application, Adjustment   Alvina ChouWilliams, Magdalene Tardiff C 01/10/2016, 12:03 PM
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
## 2017-02-13 ENCOUNTER — Encounter (HOSPITAL_COMMUNITY): Payer: Self-pay | Admitting: Emergency Medicine

## 2017-02-13 ENCOUNTER — Emergency Department (HOSPITAL_COMMUNITY)
Admission: EM | Admit: 2017-02-13 | Discharge: 2017-02-14 | Payer: Self-pay | Attending: Emergency Medicine | Admitting: Emergency Medicine

## 2017-02-13 DIAGNOSIS — F1010 Alcohol abuse, uncomplicated: Secondary | ICD-10-CM | POA: Insufficient documentation

## 2017-02-13 DIAGNOSIS — I10 Essential (primary) hypertension: Secondary | ICD-10-CM | POA: Insufficient documentation

## 2017-02-13 DIAGNOSIS — F1990 Other psychoactive substance use, unspecified, uncomplicated: Secondary | ICD-10-CM | POA: Insufficient documentation

## 2017-02-13 DIAGNOSIS — Y904 Blood alcohol level of 80-99 mg/100 ml: Secondary | ICD-10-CM | POA: Insufficient documentation

## 2017-02-13 DIAGNOSIS — R451 Restlessness and agitation: Secondary | ICD-10-CM | POA: Insufficient documentation

## 2017-02-13 DIAGNOSIS — F17210 Nicotine dependence, cigarettes, uncomplicated: Secondary | ICD-10-CM | POA: Insufficient documentation

## 2017-02-13 DIAGNOSIS — F1910 Other psychoactive substance abuse, uncomplicated: Secondary | ICD-10-CM

## 2017-02-13 MED ORDER — LORAZEPAM 2 MG/ML IJ SOLN: 2.0000 mg | Freq: Once | INTRAMUSCULAR | Status: DC

## 2017-02-13 MED ORDER — SODIUM CHLORIDE 0.9 % IV BOLUS (SEPSIS): 1000.0000 mL | Freq: Once | INTRAVENOUS | Status: DC

## 2017-02-13 NOTE — ED Notes (Signed)
Pt unwilling to let blood be drawn. Attempted to explain to pt that blood was necessary, pt still unwilling to let blood be drawn. EDP will be made aware.

## 2017-02-13 NOTE — ED Provider Notes (Signed)
[facility]   CSN: [id number] Arrival date & time: 02/13/17  2224     History   Chief Complaint Chief Complaint  Patient presents with  . Altered Mental Status    HPI Daniel Spence is a 27 y.o. male.  The history is provided by the patient. No language interpreter was used.  Altered Mental Status     Daniel Spence is a 27 y.o. male who presents to the Emergency Department complaining of AMS.  Level V caveat due to AMS, agitation.  He is brought in by EMS in police custody.  He was in a holding cell after being searched and arrested.  He was found unresponsive with agonal respirations and EMS was called.  He was given 2 mg of IV Narcan with immediate improvement in his mental status. Past Medical History:  Diagnosis Date  . Hypertension    states is always told his BP is "a little high", has never followed up with a PCP  . Laceration of wrist, right 05/06/2015    There are no active problems to display for this patient.   Past Surgical History:  Procedure Laterality Date  . NO PAST SURGERIES    . TENDON REPAIR Right 05/13/2015   Procedure: EXPLORE, repair extensor tendons;  Surgeon: Dairl PonderMatthew Weingold, MD;  Location: [facility];  Service: Orthopedics;  Laterality: Right;       Home Medications    Prior to Admission medications   Medication Sig Start Date End Date Taking? Authorizing Provider  naproxen (NAPROSYN) 500 MG tablet Take 1 tablet (500 mg total) by mouth 2 (two) times daily with a meal. 01/10/16   Everlene Farrieransie, William, PA-C  oxyCODONE-acetaminophen (PERCOCET) 5-325 MG tablet Take 1-2 tablets by mouth every 6 (six) hours as needed. 05/07/15   Geoffery Lyonselo, Douglas, MD  oxyCODONE-acetaminophen (ROXICET) 5-325 MG tablet Take 1 tablet by mouth every 4 (four) hours as needed for severe pain. 05/13/15   Dairl PonderWeingold, Matthew, MD    Family History History reviewed. No pertinent family history.  Social  History Social History   Tobacco Use  . Smoking status: Current Every Day Smoker    Packs/day: 0.00    Years: 9.00    Pack years: 0.00    Types: Cigarettes  . Smokeless tobacco: Never Used  . Tobacco comment: 1 pack/week  Substance Use Topics  . Alcohol use: Yes    Comment: rarely  . Drug use: Yes    Types: Marijuana     Allergies   Patient has no known allergies.   Review of Systems Review of Systems  All other systems reviewed and are negative.    Physical Exam Updated Vital Signs BP (!) 137/93   Pulse (!) 109   Resp (!) 23   SpO2 100%   Physical Exam  Constitutional: He is oriented to person, place, and time. He appears well-developed and well-nourished.  HENT:  Head: Normocephalic and atraumatic.  Cardiovascular: Regular rhythm.  tachycardic  Pulmonary/Chest: Effort normal. No respiratory distress.  Abdominal: Soft. There is no tenderness. There is no rebound and no guarding.  Musculoskeletal: He exhibits no edema or tenderness.  Neurological: He is alert and oriented to person, place, and time.  Skin: Skin is warm and dry.  Psychiatric:  Agitated and shouting.  Difficult to redirect.  Nursing note and vitals reviewed.    ED Treatments / Results  Labs (all labs ordered are listed, but only abnormal results are displayed) Labs Reviewed  CBC WITH DIFFERENTIAL/PLATELET - Abnormal; Notable for the following components:      Result Value   WBC 11.4 (*)    Neutro Abs 8.2 (*)    All other components within normal limits  COMPREHENSIVE METABOLIC PANEL  ETHANOL  ACETAMINOPHEN LEVEL  SALICYLATE LEVEL  I-STAT TROPONIN, ED    EKG  EKG Interpretation  Date/Time:  Saturday February 13 2017 23:16:15 EST Ventricular Rate:  113 PR Interval:    QRS Duration: 85 QT Interval:  306 QTC Calculation: 420 R Axis:   88 Text Interpretation:  Sinus tachycardia ST elev, probable normal early repol pattern Confirmed by Tilden Fossa [phone]) on 02/13/2017 11:44:40  PM       Radiology No results found.  Procedures Procedures (including critical care time)  Medications Ordered in ED Medications  LORazepam (ATIVAN) injection 2 mg (2 mg Intravenous Refused 02/14/17 0000)  sodium chloride 0.9 % bolus 1,000 mL (1,000 mLs Intravenous Refused 02/13/17 2359)     Initial Impression / Assessment and Plan / ED Course  I have reviewed the triage vital signs and the nursing notes.  Pertinent labs & imaging results that were available during my care of the patient were reviewed by me and considered in my medical decision making (see chart for details).     Patient brought in police custody after having an unresponsive episode with agonal respirations.  On EMS arrival he was given Narcan and became agitated and combative.  There were no witnessed ingestions and patient denies any drug use.  He does endorse drinking a 40 ounce beer today.  Patient is agitated in the emergency department but can be verbally redirected and de-escalated.  He is agreeable to blood draw to evaluate for electrolyte abnormalities.  Plan to observe in the department for evidence of recurrent respiratory depression.  Patient care transferred pending observation and lab draw.  Final Clinical Impressions(s) / ED Diagnoses   Final diagnoses:  None    ED Discharge Orders    None       Tilden Fossa, MD 02/14/17 [phone]

## 2017-02-13 NOTE — ED Notes (Signed)
Patient agreed to allow this RN to put him on the basic monitor.  Patient continues to argue with anyone who will listen.  Patient yelling at this RN, MD who is attempting to assist him, and other [employer] officers who are present.  Patient continues to be handcuffed to the bed.

## 2017-02-13 NOTE — ED Notes (Signed)
Pt refused blood draw, medications and care.  Patient continues to argue.  MD aware of need for IVC to continue to care for patient.

## 2017-02-13 NOTE — ED Notes (Signed)
Pt gave this RN multiple #s to attempt to reach his family, no answer. Voicemail left w/ callback number.

## 2017-02-13 NOTE — ED Notes (Signed)
Pt states that he is willing to have his blood drawn soon.

## 2017-02-13 NOTE — ED Notes (Signed)
Pt refused blood draw,  Nurse notfied.

## 2017-02-13 NOTE — ED Triage Notes (Signed)
Per EMS:  Patient presents to ED for assessment after being arrested and becoming unresponsive in his cell.  Patient noted to have pinpoint pupils, not breathing, given 2mg  of Narcan.  Now combative, arguing with [employer] in hallway.

## 2017-02-14 LAB — CBC WITH DIFFERENTIAL/PLATELET
BASOS PCT: 0 %
Basophils Absolute: 0 10*3/uL (ref 0.0–0.1)
EOS ABS: 0.1 10*3/uL (ref 0.0–0.7)
Eosinophils Relative: 1 %
HEMATOCRIT: 43.9 % (ref 39.0–52.0)
HEMOGLOBIN: 14.9 g/dL (ref 13.0–17.0)
LYMPHS ABS: 2.8 10*3/uL (ref 0.7–4.0)
Lymphocytes Relative: 24 %
MCH: 30.4 pg (ref 26.0–34.0)
MCHC: 33.9 g/dL (ref 30.0–36.0)
MCV: 89.6 fL (ref 78.0–100.0)
MONOS PCT: 4 %
Monocytes Absolute: 0.4 10*3/uL (ref 0.1–1.0)
NEUTROS ABS: 8.2 10*3/uL — AB (ref 1.7–7.7)
NEUTROS PCT: 71 %
Platelets: 319 10*3/uL (ref 150–400)
RBC: 4.9 MIL/uL (ref 4.22–5.81)
RDW: 13.7 % (ref 11.5–15.5)
WBC: 11.4 10*3/uL — AB (ref 4.0–10.5)

## 2017-02-14 LAB — COMPREHENSIVE METABOLIC PANEL
ALBUMIN: 3.9 g/dL (ref 3.5–5.0)
ALT: 26 U/L (ref 17–63)
AST: 35 U/L (ref 15–41)
Alkaline Phosphatase: 66 U/L (ref 38–126)
Anion gap: 12 (ref 5–15)
BUN: 11 mg/dL (ref 6–20)
CHLORIDE: 104 mmol/L (ref 101–111)
CO2: 22 mmol/L (ref 22–32)
CREATININE: 1.17 mg/dL (ref 0.61–1.24)
Calcium: 8.9 mg/dL (ref 8.9–10.3)
GFR calc Af Amer: >60 mL/min (ref >60)
GFR calc non Af Amer: >60 mL/min (ref >60)
Glucose, Bld: 97 mg/dL (ref 65–99)
POTASSIUM: 3.9 mmol/L (ref 3.5–5.1)
SODIUM: 138 mmol/L (ref 135–145)
Total Bilirubin: 0.4 mg/dL (ref 0.3–1.2)
Total Protein: 7.1 g/dL (ref 6.5–8.1)

## 2017-02-14 LAB — ACETAMINOPHEN LEVEL: Acetaminophen (Tylenol), Serum: <10 ug/mL — ABNORMAL LOW (ref 10–30)

## 2017-02-14 LAB — RAPID URINE DRUG SCREEN, HOSP PERFORMED
AMPHETAMINES: POSITIVE — AB
Barbiturates: NOT DETECTED
Benzodiazepines: NOT DETECTED
COCAINE: POSITIVE — AB
Opiates: NOT DETECTED
Tetrahydrocannabinol: POSITIVE — AB

## 2017-02-14 LAB — ETHANOL: ALCOHOL ETHYL (B): 86 mg/dL — AB (ref <10)

## 2017-02-14 LAB — SALICYLATE LEVEL: Salicylate Lvl: <7 mg/dL (ref 2.8–30.0)

## 2017-02-14 LAB — I-STAT TROPONIN, ED: TROPONIN I, POC: 0 ng/mL (ref 0.00–0.08)

## 2017-02-14 NOTE — ED Provider Notes (Signed)
Signed out pending lab work.  In brief, patient presented to the emergency room after being found unresponsive in his jail cell.  He was at intake after being arrested.  He received Narcan and became agitated.  Initially very agitated but then was redirectable.  Lab workup is notable for an alcohol level of 86.  Otherwise metabolic panel is reassuring.  I was called to the bedside as the patient now is agitated and wanting to leave.  He is under police custody and is now handcuffed to the bed.  He is redirectable.  He is upset that we did not check his urine for opiates.  He states "they say I died and came back and I want to know why."  He is awake, alert, oriented and has capacity.  UDS was obtained; however, given other reassuring lab work, and we will not wait for results.  Presumed opiate OD.  He has been observed in the emergency room for over 2-1/2 hours with no return of somnolence or unresponsiveness.  Patient discharged into police custody.  After history, exam, and medical workup I feel the patient has been appropriately medically screened and is safe for discharge home. Pertinent diagnoses were discussed with the patient. Patient was given return precautions.    Shon BatonHorton, Morgin Halls F, MD 02/14/17 [phone]

## 2017-02-14 NOTE — ED Notes (Signed)
Pt removed all EKG wires off his person. Sts he "they're irritating me, and if it's not by law that I have to have them on, then I don't want them on." This RN attempted to let pt know that we needed it to monitor his hr, pt still adamant about not wanting wires on.

## 2017-02-14 NOTE — ED Notes (Signed)
Called to bedside for complaint: Pt upset at nursing staff prior to 11pm, stating "those nurses are laughing at me." Attempted to talk to patient, pt continued to talk over this RN, stating "you don't even have a notepad. I'm making a complaint and you aren't even taking notes." Informed pt of complaint process and pt requested "a man supervisor." AC made aware

## 2017-02-14 NOTE — ED Notes (Signed)
Pt earring given to police.

## 2017-02-14 NOTE — ED Notes (Signed)
Chrys, AC at bedside speaking to pt per request

## 2017-02-14 NOTE — ED Notes (Signed)
Pt given PO fluids. Tolerating fluids well. Showing NAD. RR even and unlabored.  

## 2019-08-30 ENCOUNTER — Emergency Department (HOSPITAL_COMMUNITY)
Admission: EM | Admit: 2019-08-30 | Discharge: 2019-08-30 | Disposition: A | Discharge status: Home / Self Care | Payer: Self-pay | Attending: Emergency Medicine | Admitting: Emergency Medicine

## 2019-08-30 ENCOUNTER — Emergency Department (HOSPITAL_COMMUNITY): Payer: Self-pay

## 2019-08-30 DIAGNOSIS — F10920 Alcohol use, unspecified with intoxication, uncomplicated: Secondary | ICD-10-CM

## 2019-08-30 DIAGNOSIS — Y905 Blood alcohol level of 100-119 mg/100 ml: Secondary | ICD-10-CM | POA: Insufficient documentation

## 2019-08-30 DIAGNOSIS — F10129 Alcohol abuse with intoxication, unspecified: Secondary | ICD-10-CM | POA: Insufficient documentation

## 2019-08-30 LAB — CBC WITH DIFFERENTIAL/PLATELET
Abs Immature Granulocytes: 0.02 10*3/uL (ref 0.00–0.07)
Basophils Absolute: 0 10*3/uL (ref 0.0–0.1)
Basophils Relative: 0 %
Eosinophils Absolute: 0.2 10*3/uL (ref 0.0–0.5)
Eosinophils Relative: 2 %
HCT: 41.3 % (ref 39.0–52.0)
Hemoglobin: 13.8 g/dL (ref 13.0–17.0)
Immature Granulocytes: 0 %
Lymphocytes Relative: 49 %
Lymphs Abs: 3.4 10*3/uL (ref 0.7–4.0)
MCH: 30.2 pg (ref 26.0–34.0)
MCHC: 33.4 g/dL (ref 30.0–36.0)
MCV: 90.4 fL (ref 80.0–100.0)
Monocytes Absolute: 0.4 10*3/uL (ref 0.1–1.0)
Monocytes Relative: 6 %
Neutro Abs: 3.1 10*3/uL (ref 1.7–7.7)
Neutrophils Relative %: 43 %
Platelets: 300 10*3/uL (ref 150–400)
RBC: 4.57 MIL/uL (ref 4.22–5.81)
RDW: 12.3 % (ref 11.5–15.5)
WBC: 7.2 10*3/uL (ref 4.0–10.5)
nRBC: 0 % (ref 0.0–0.2)

## 2019-08-30 LAB — BASIC METABOLIC PANEL
Anion gap: 6 (ref 5–15)
BUN: 5 mg/dL — ABNORMAL LOW (ref 6–20)
CO2: 25 mmol/L (ref 22–32)
Calcium: 8.5 mg/dL — ABNORMAL LOW (ref 8.9–10.3)
Chloride: 107 mmol/L (ref 98–111)
Creatinine, Ser: 0.95 mg/dL (ref 0.61–1.24)
GFR calc Af Amer: 56 mL/min — ABNORMAL LOW (ref >60)
GFR calc non Af Amer: 48 mL/min — ABNORMAL LOW (ref >60)
Glucose, Bld: 90 mg/dL (ref 70–99)
Potassium: 3.3 mmol/L — ABNORMAL LOW (ref 3.5–5.1)
Sodium: 138 mmol/L (ref 135–145)

## 2019-08-30 LAB — ETHANOL: Alcohol, Ethyl (B): 114 mg/dL — ABNORMAL HIGH (ref <10)
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
## 2019-08-30 NOTE — ED Notes (Signed)
Pt will not allow temperature to be taken.

## 2019-10-12 ENCOUNTER — Other Ambulatory Visit: Payer: Self-pay

## 2019-10-12 ENCOUNTER — Encounter (HOSPITAL_COMMUNITY): Payer: Self-pay

## 2019-10-12 ENCOUNTER — Emergency Department (HOSPITAL_COMMUNITY)
Admission: EM | Admit: 2019-10-12 | Discharge: 2019-10-12 | Disposition: A | Discharge status: Home / Self Care | Payer: Self-pay | Attending: Emergency Medicine | Admitting: Emergency Medicine

## 2019-10-12 DIAGNOSIS — I10 Essential (primary) hypertension: Secondary | ICD-10-CM | POA: Insufficient documentation

## 2019-10-12 DIAGNOSIS — F17210 Nicotine dependence, cigarettes, uncomplicated: Secondary | ICD-10-CM | POA: Insufficient documentation

## 2019-10-12 DIAGNOSIS — R55 Syncope and collapse: Secondary | ICD-10-CM | POA: Insufficient documentation

## 2019-10-12 LAB — CBC WITH DIFFERENTIAL/PLATELET
Abs Immature Granulocytes: 0.01 10*3/uL (ref 0.00–0.07)
Basophils Absolute: 0 10*3/uL (ref 0.0–0.1)
Basophils Relative: 1 %
Eosinophils Absolute: 0.2 10*3/uL (ref 0.0–0.5)
Eosinophils Relative: 3 %
HCT: 41.1 % (ref 39.0–52.0)
Hemoglobin: 13.8 g/dL (ref 13.0–17.0)
Immature Granulocytes: 0 %
Lymphocytes Relative: 41 %
Lymphs Abs: 2.6 10*3/uL (ref 0.7–4.0)
MCH: 30.1 pg (ref 26.0–34.0)
MCHC: 33.6 g/dL (ref 30.0–36.0)
MCV: 89.7 fL (ref 80.0–100.0)
Monocytes Absolute: 0.5 10*3/uL (ref 0.1–1.0)
Monocytes Relative: 8 %
Neutro Abs: 3 10*3/uL (ref 1.7–7.7)
Neutrophils Relative %: 47 %
Platelets: 337 10*3/uL (ref 150–400)
RBC: 4.58 MIL/uL (ref 4.22–5.81)
RDW: 12.3 % (ref 11.5–15.5)
WBC: 6.3 10*3/uL (ref 4.0–10.5)
nRBC: 0 % (ref 0.0–0.2)

## 2019-10-12 LAB — COMPREHENSIVE METABOLIC PANEL
ALT: 69 U/L — ABNORMAL HIGH (ref 0–44)
AST: 54 U/L — ABNORMAL HIGH (ref 15–41)
Albumin: 3.8 g/dL (ref 3.5–5.0)
Alkaline Phosphatase: 50 U/L (ref 38–126)
Anion gap: 10 (ref 5–15)
BUN: 11 mg/dL (ref 6–20)
CO2: 25 mmol/L (ref 22–32)
Calcium: 9.1 mg/dL (ref 8.9–10.3)
Chloride: 106 mmol/L (ref 98–111)
Creatinine, Ser: 1.02 mg/dL (ref 0.61–1.24)
GFR calc non Af Amer: >60 mL/min (ref >60)
Glucose, Bld: 79 mg/dL (ref 70–99)
Potassium: 3.4 mmol/L — ABNORMAL LOW (ref 3.5–5.1)
Sodium: 141 mmol/L (ref 135–145)
Total Bilirubin: 0.7 mg/dL (ref 0.3–1.2)
Total Protein: 6.5 g/dL (ref 6.5–8.1)

## 2019-10-12 LAB — TROPONIN I (HIGH SENSITIVITY): Troponin I (High Sensitivity): 3 ng/L (ref <18)

## 2019-10-12 LAB — ETHANOL: Alcohol, Ethyl (B): 83 mg/dL — ABNORMAL HIGH (ref <10)
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
## 2019-10-12 NOTE — ED Triage Notes (Signed)
Pt BIB [facility] from the jail. Pt was being transported to jail, when they arrived pt had a "synocpal" episode and acted like he could not wake up. EMS was called. EMS stated pt would move but would not answer questions. Pt had to be carried out of the cop car. Pt is c/o of pain in the abdomen, jaw and nose.

## 2019-11-17 ENCOUNTER — Emergency Department (HOSPITAL_COMMUNITY): Payer: Self-pay

## 2019-11-17 ENCOUNTER — Emergency Department (HOSPITAL_COMMUNITY)
Admission: EM | Admit: 2019-11-17 | Discharge: 2019-11-17 | Disposition: A | Discharge status: Home / Self Care | Payer: Self-pay | Attending: Emergency Medicine | Admitting: Emergency Medicine

## 2019-11-17 ENCOUNTER — Other Ambulatory Visit: Payer: Self-pay

## 2019-11-17 ENCOUNTER — Encounter (HOSPITAL_COMMUNITY): Payer: Self-pay | Admitting: Radiology

## 2019-11-17 DIAGNOSIS — S01511A Laceration without foreign body of lip, initial encounter: Secondary | ICD-10-CM | POA: Insufficient documentation

## 2019-11-17 DIAGNOSIS — Z23 Encounter for immunization: Secondary | ICD-10-CM | POA: Insufficient documentation

## 2019-11-17 DIAGNOSIS — F17210 Nicotine dependence, cigarettes, uncomplicated: Secondary | ICD-10-CM | POA: Insufficient documentation

## 2019-11-17 DIAGNOSIS — I10 Essential (primary) hypertension: Secondary | ICD-10-CM | POA: Insufficient documentation

## 2019-11-17 DIAGNOSIS — R519 Headache, unspecified: Secondary | ICD-10-CM | POA: Insufficient documentation

## 2019-11-17 DIAGNOSIS — W19XXXA Unspecified fall, initial encounter: Secondary | ICD-10-CM | POA: Insufficient documentation

## 2019-11-17 MED ORDER — NAPROXEN 375 MG PO TABS: 375.0000 mg | ORAL_TABLET | Freq: Two times a day (BID) | ORAL | 0 refills | Status: DC

## 2019-11-17 MED ORDER — PENICILLIN V POTASSIUM 500 MG PO TABS: 500.0000 mg | ORAL_TABLET | Freq: Three times a day (TID) | ORAL | 0 refills | Status: DC

## 2019-11-17 MED ORDER — TETANUS-DIPHTH-ACELL PERTUSSIS 5-2.5-18.5 LF-MCG/0.5 IM SUSY
0.5000 mL | PREFILLED_SYRINGE | Freq: Once | INTRAMUSCULAR | Status: AC
  Administered 2019-11-17: 0.5 mL via INTRAMUSCULAR
  Filled 2019-11-17: qty 0.5
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
## 2019-11-17 NOTE — Discharge Instructions (Signed)
The lacerations you have would be something we would typically suture.  Take the antibiotics to help prevent infection.   Keep a bandage over the external cut.
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
## 2019-11-17 NOTE — ED Notes (Signed)
Patient provided with bus pass

## 2019-11-17 NOTE — ED Notes (Signed)
Given patient washcloth and encouraged to wipe face.

## 2021-04-22 IMAGING — CT CT MAXILLOFACIAL W/O CM
3 series · 15 of 47 positions shown, 18 images · non-contrast
Comparison: None.

CLINICAL DATA: Recent fall with facial pain and headaches, initial
encounter

EXAM:
CT HEAD WITHOUT CONTRAST
CT MAXILLOFACIAL WITHOUT CONTRAST
CT CERVICAL SPINE WITHOUT CONTRAST
TECHNIQUE: Multidetector CT imaging of the head, cervical spine, and
maxillofacial structures were performed using the standard protocol
without intravenous contrast. Multiplanar CT image reconstructions
of the cervical spine and maxillofacial structures were also
generated.

[Series 2: max soft · axial · 0.33mm/px · z∈[-238,-86]mm · 9 of 90 slices shown, 12 images]
[im 7/90  brain]
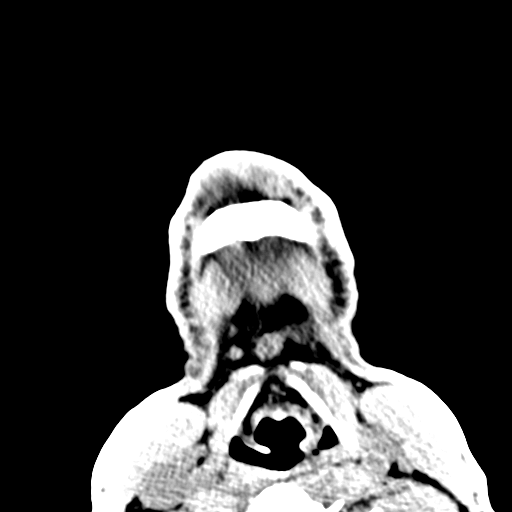
[im 7/90  bone]
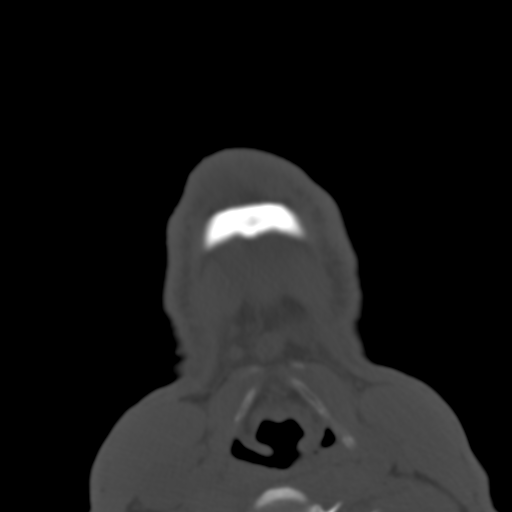
[im 16/90  bone]
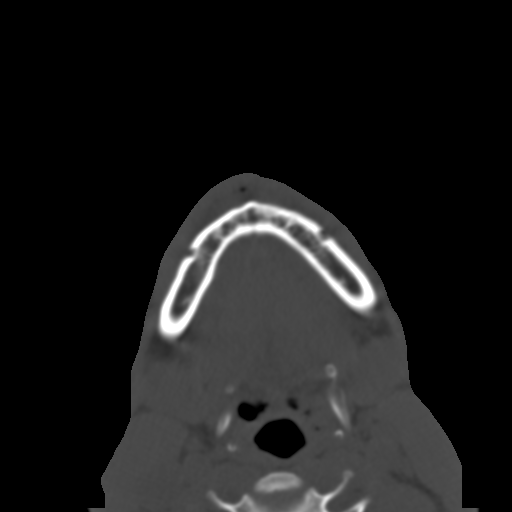
[im 25/90  bone]
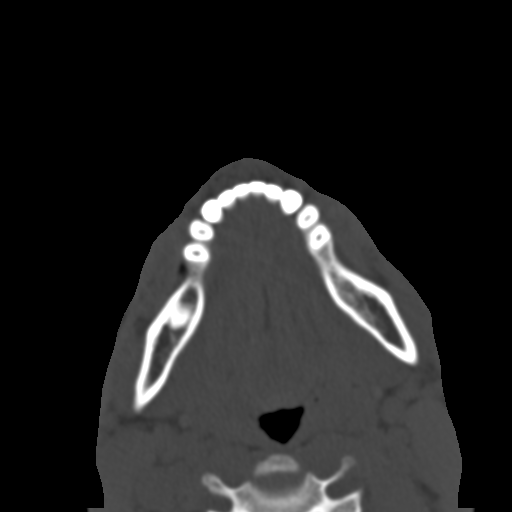
[im 34/90  bone]
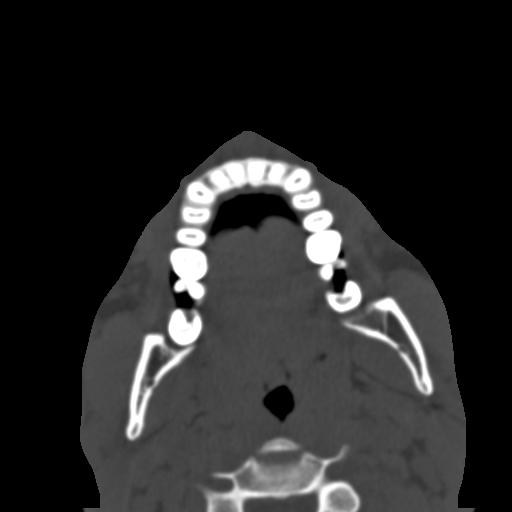
[im 47/90  brain]
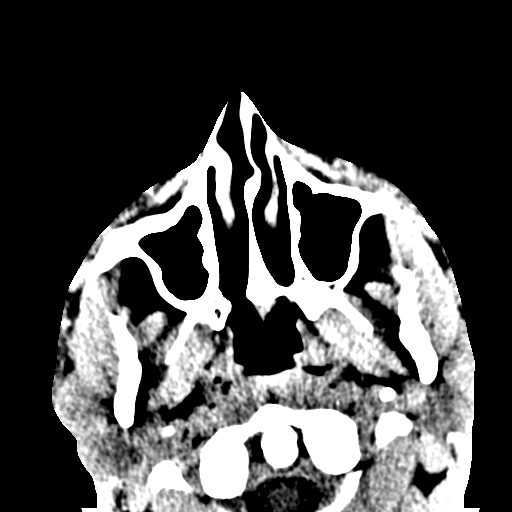
[im 47/90  bone]
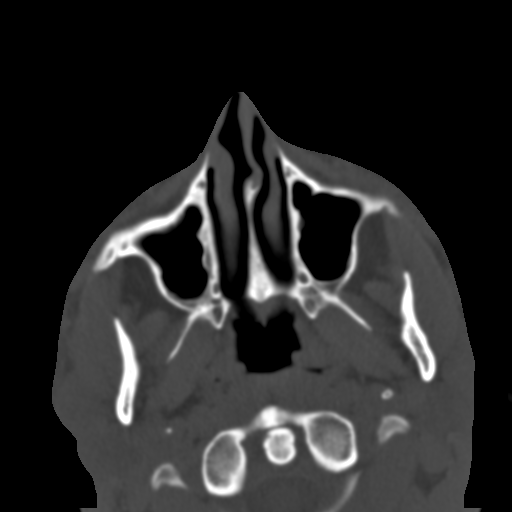
[im 56/90  bone]
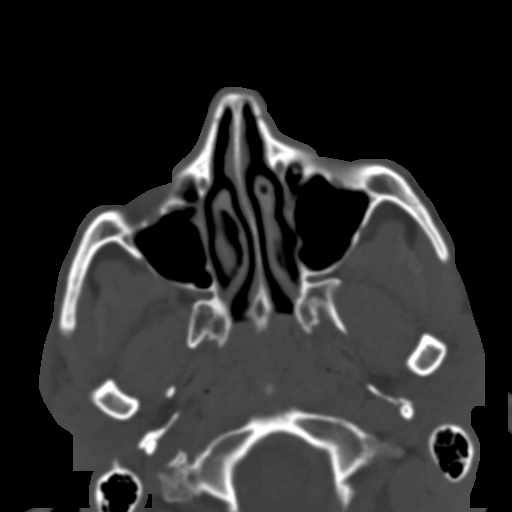
[im 65/90  bone]
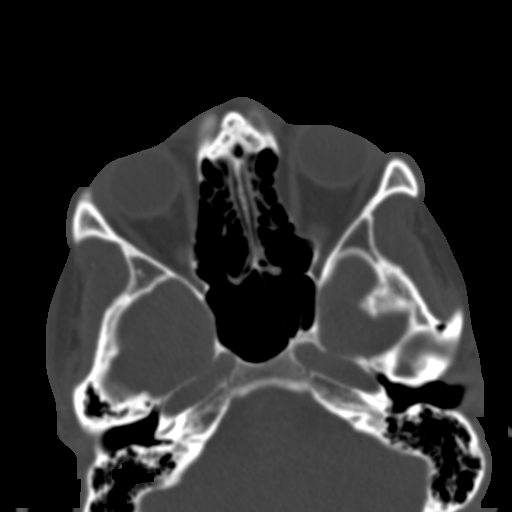
[im 74/90  bone]
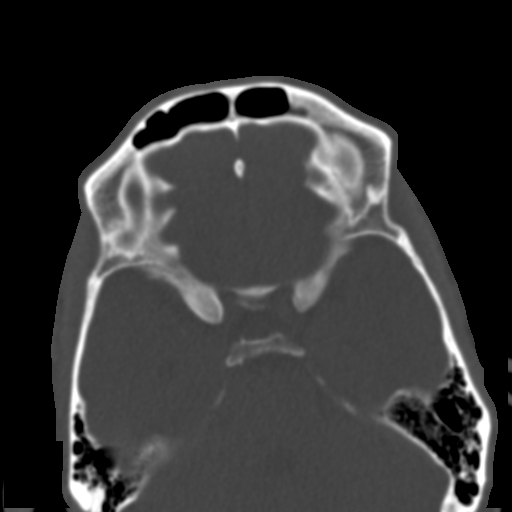
[im 83/90  brain]
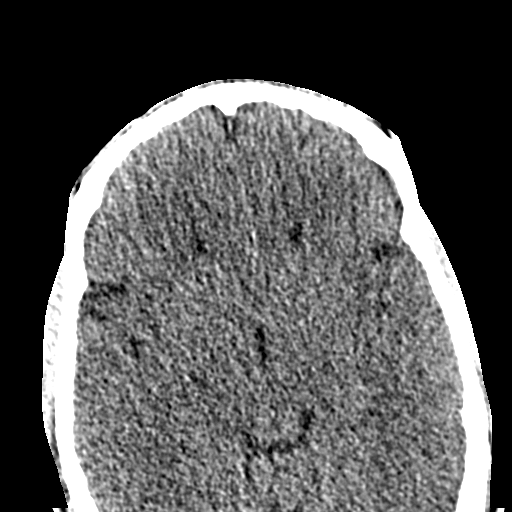
[im 83/90  bone]
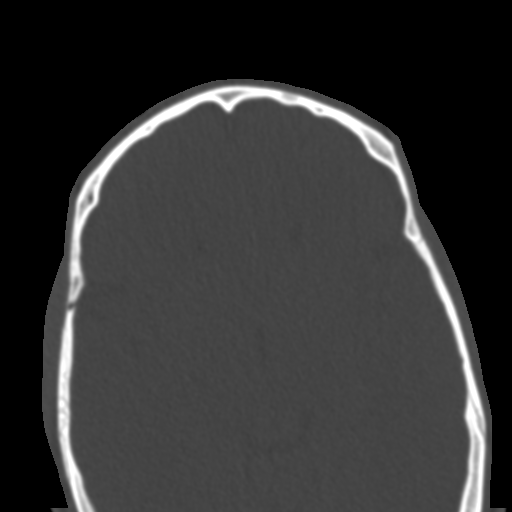

[Series 6: coronal soft · coronal · 0.41mm/px · 3 of 85 slices shown]
[im 29/85  bone]
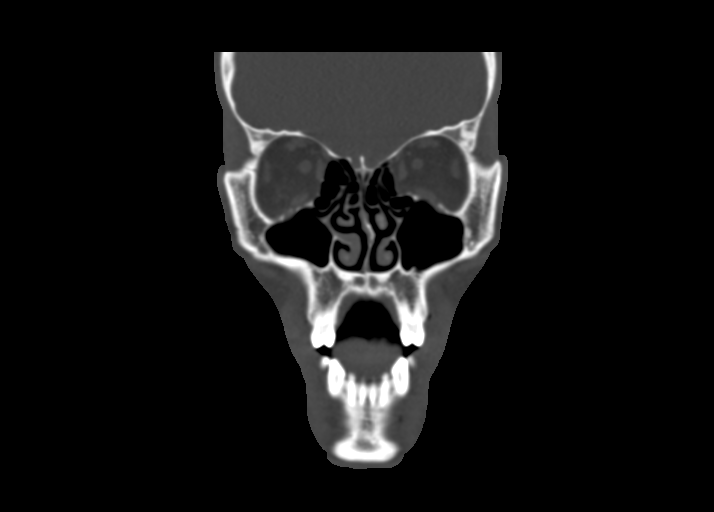
[im 38/85  bone]
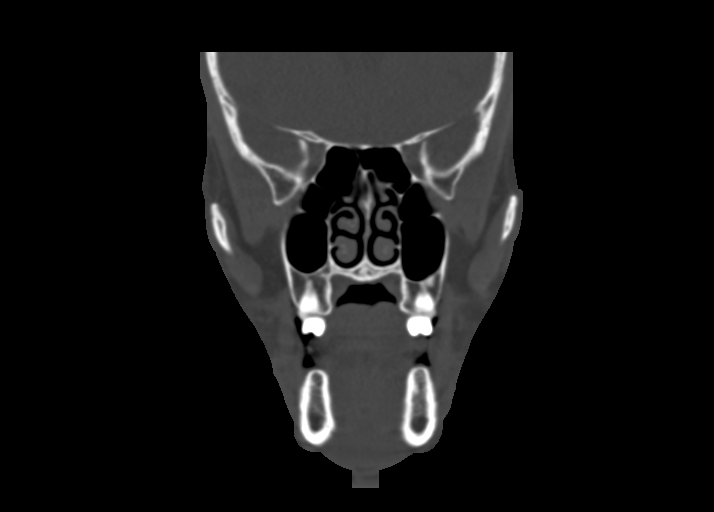
[im 47/85  bone]
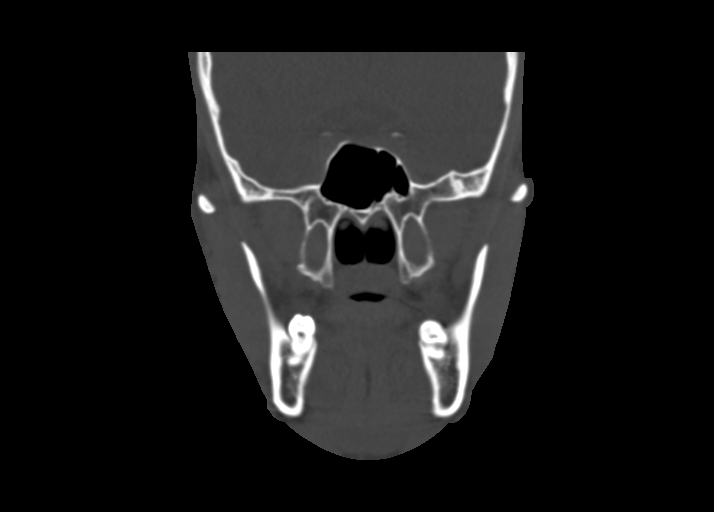

[Series 7: sagittal soft · sagittal · 0.40mm/px · 3 of 78 slices shown]
[im 26/78  bone]
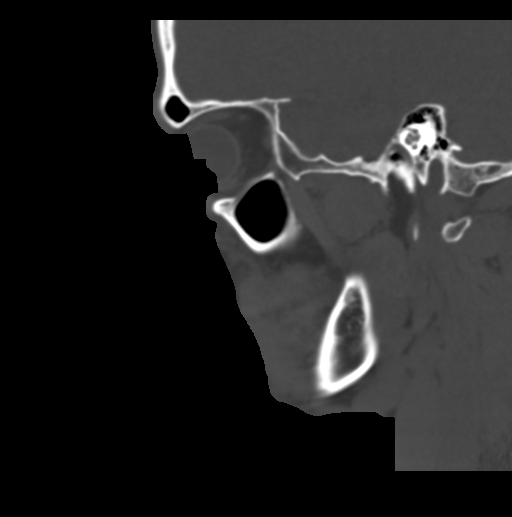
[im 39/78  bone]
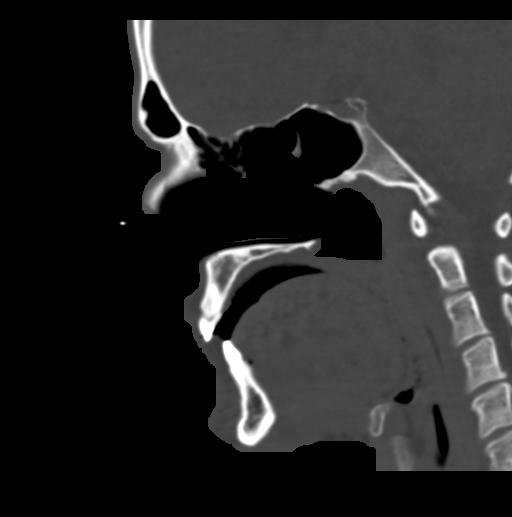
[im 52/78  bone]
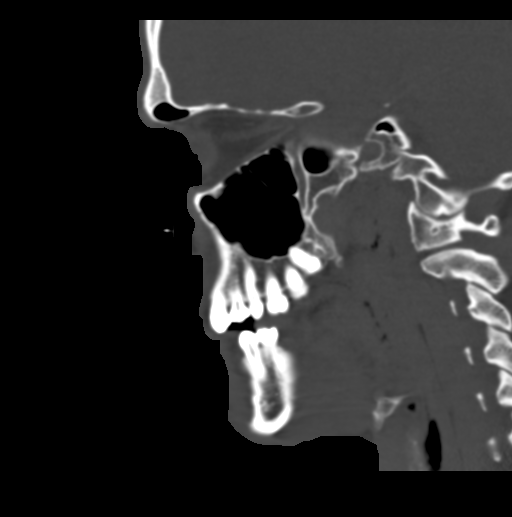

[15 of 47 positions shown; findings below may reference images not displayed]

FINDINGS: CT HEAD FINDINGS

Brain: No evidence of acute infarction, hemorrhage, hydrocephalus,
extra-axial collection or mass lesion/mass effect.

Vascular: No hyperdense vessel or unexpected calcification.

Skull: Normal. Negative for fracture or focal lesion.

Other: None.

CT MAXILLOFACIAL FINDINGS

Osseous: Bony structures show no acute fracture.

Orbits: Orbits and their contents are within normal limits.

Sinuses: Paranasal sinuses are well aerated. No air-fluid levels are
noted.

Soft tissues: There is a laceration in the lower lip in the midline
consistent with the recent injury. Some associated subcutaneous air
is noted. No sizable hematoma is noted. No other soft tissue
abnormality is seen.

CT CERVICAL SPINE FINDINGS

Alignment: Within normal limits.

Skull base and vertebrae: 7 cervical segments are well visualized.
Vertebral body height is well maintained. No acute fracture or acute
facet abnormality is noted. The odontoid is within normal limits.

Soft tissues and spinal canal: Surrounding soft tissue structures
are within normal limits. No hematoma is seen.

Upper chest: Visualized lung apices are unremarkable.

Other: None
IMPRESSION: CT of the head: No acute intracranial abnormality noted.

CT of the maxillofacial bones: Laceration to the lip consistent with
the given clinical history.

No acute bony abnormality is noted.

CT of the cervical spine: No acute abnormality noted.

## 2022-09-19 ENCOUNTER — Emergency Department (HOSPITAL_COMMUNITY): Payer: Medicaid Other

## 2022-09-19 ENCOUNTER — Inpatient Hospital Stay (HOSPITAL_COMMUNITY)
Admission: EM | Admit: 2022-09-19 | Discharge: 2022-09-23 | DRG: 493 | Disposition: A | Discharge status: Home / Self Care | Payer: Medicaid Other | Attending: Orthopedic Surgery | Admitting: Orthopedic Surgery

## 2022-09-19 ENCOUNTER — Encounter (HOSPITAL_COMMUNITY): Payer: Self-pay | Admitting: Emergency Medicine

## 2022-09-19 ENCOUNTER — Encounter (HOSPITAL_COMMUNITY)
Admission: EM | Disposition: A | Discharge status: Home / Self Care | Payer: Self-pay | Source: Home / Self Care | Attending: Orthopedic Surgery

## 2022-09-19 ENCOUNTER — Other Ambulatory Visit: Payer: Self-pay

## 2022-09-19 ENCOUNTER — Inpatient Hospital Stay (HOSPITAL_COMMUNITY): Payer: Medicaid Other

## 2022-09-19 ENCOUNTER — Emergency Department (HOSPITAL_COMMUNITY): Payer: Medicaid Other | Admitting: Anesthesiology

## 2022-09-19 DIAGNOSIS — Z5900 Homelessness unspecified: Secondary | ICD-10-CM

## 2022-09-19 DIAGNOSIS — S80212A Abrasion, left knee, initial encounter: Secondary | ICD-10-CM | POA: Diagnosis present

## 2022-09-19 DIAGNOSIS — F17210 Nicotine dependence, cigarettes, uncomplicated: Secondary | ICD-10-CM | POA: Diagnosis present

## 2022-09-19 DIAGNOSIS — S82262F Displaced segmental fracture of shaft of left tibia, subsequent encounter for open fracture type IIIA, IIIB, or IIIC with routine healing: Secondary | ICD-10-CM

## 2022-09-19 DIAGNOSIS — S85312A Laceration of greater saphenous vein at lower leg level, left leg, initial encounter: Secondary | ICD-10-CM | POA: Diagnosis present

## 2022-09-19 DIAGNOSIS — S82202B Unspecified fracture of shaft of left tibia, initial encounter for open fracture type I or II: Secondary | ICD-10-CM | POA: Diagnosis not present

## 2022-09-19 DIAGNOSIS — S82402B Unspecified fracture of shaft of left fibula, initial encounter for open fracture type I or II: Secondary | ICD-10-CM | POA: Diagnosis not present

## 2022-09-19 DIAGNOSIS — S80211A Abrasion, right knee, initial encounter: Secondary | ICD-10-CM | POA: Diagnosis present

## 2022-09-19 DIAGNOSIS — I10 Essential (primary) hypertension: Secondary | ICD-10-CM | POA: Diagnosis present

## 2022-09-19 DIAGNOSIS — Z23 Encounter for immunization: Secondary | ICD-10-CM

## 2022-09-19 DIAGNOSIS — Y92410 Unspecified street and highway as the place of occurrence of the external cause: Secondary | ICD-10-CM

## 2022-09-19 DIAGNOSIS — S82252C Displaced comminuted fracture of shaft of left tibia, initial encounter for open fracture type IIIA, IIIB, or IIIC: Secondary | ICD-10-CM | POA: Diagnosis present

## 2022-09-19 DIAGNOSIS — S85182A Other specified injury of posterior tibial artery, left leg, initial encounter: Secondary | ICD-10-CM | POA: Diagnosis not present

## 2022-09-19 DIAGNOSIS — D62 Acute posthemorrhagic anemia: Secondary | ICD-10-CM | POA: Diagnosis not present

## 2022-09-19 DIAGNOSIS — Y9301 Activity, walking, marching and hiking: Secondary | ICD-10-CM | POA: Diagnosis present

## 2022-09-19 DIAGNOSIS — S82452C Displaced comminuted fracture of shaft of left fibula, initial encounter for open fracture type IIIA, IIIB, or IIIC: Secondary | ICD-10-CM | POA: Diagnosis present

## 2022-09-19 DIAGNOSIS — E559 Vitamin D deficiency, unspecified: Secondary | ICD-10-CM | POA: Diagnosis present

## 2022-09-19 HISTORY — PX: TIBIA IM NAIL INSERTION: SHX2516

## 2022-09-19 HISTORY — PX: WOUND EXPLORATION: SHX6188

## 2022-09-19 LAB — CBC
HCT: 39.5 % (ref 39.0–52.0)
HCT: 33.2 % — ABNORMAL LOW (ref 39.0–52.0)
Hemoglobin: 12.9 g/dL — ABNORMAL LOW (ref 13.0–17.0)
Hemoglobin: 10.9 g/dL — ABNORMAL LOW (ref 13.0–17.0)
MCH: 28.9 pg (ref 26.0–34.0)
MCH: 29.7 pg (ref 26.0–34.0)
MCHC: 32.7 g/dL (ref 30.0–36.0)
MCHC: 32.8 g/dL (ref 30.0–36.0)
MCV: 88.6 fL (ref 80.0–100.0)
MCV: 90.5 fL (ref 80.0–100.0)
Platelets: 357 10*3/uL (ref 150–400)
Platelets: 306 10*3/uL (ref 150–400)
RBC: 4.46 MIL/uL (ref 4.22–5.81)
RBC: 3.67 MIL/uL — ABNORMAL LOW (ref 4.22–5.81)
RDW: 13.3 % (ref 11.5–15.5)
RDW: 13.5 % (ref 11.5–15.5)
WBC: 9.9 10*3/uL (ref 4.0–10.5)
WBC: 10.9 10*3/uL — ABNORMAL HIGH (ref 4.0–10.5)
nRBC: 0 % (ref 0.0–0.2)
nRBC: 0 % (ref 0.0–0.2)

## 2022-09-19 LAB — COMPREHENSIVE METABOLIC PANEL
ALT: 90 U/L — ABNORMAL HIGH (ref 0–44)
ALT: 80 U/L — ABNORMAL HIGH (ref 0–44)
AST: 71 U/L — ABNORMAL HIGH (ref 15–41)
AST: 82 U/L — ABNORMAL HIGH (ref 15–41)
Albumin: 4 g/dL (ref 3.5–5.0)
Albumin: 3.3 g/dL — ABNORMAL LOW (ref 3.5–5.0)
Alkaline Phosphatase: 47 U/L (ref 38–126)
Alkaline Phosphatase: 35 U/L — ABNORMAL LOW (ref 38–126)
Anion gap: 12 (ref 5–15)
Anion gap: 8 (ref 5–15)
BUN: 16 mg/dL (ref 6–20)
BUN: 11 mg/dL (ref 6–20)
CO2: 24 mmol/L (ref 22–32)
CO2: 26 mmol/L (ref 22–32)
Calcium: 9.1 mg/dL (ref 8.9–10.3)
Calcium: 8.7 mg/dL — ABNORMAL LOW (ref 8.9–10.3)
Chloride: 101 mmol/L (ref 98–111)
Chloride: 99 mmol/L (ref 98–111)
Creatinine, Ser: 1.32 mg/dL — ABNORMAL HIGH (ref 0.61–1.24)
Creatinine, Ser: 0.97 mg/dL (ref 0.61–1.24)
GFR, Estimated: >60 mL/min (ref >60)
GFR, Estimated: >60 mL/min (ref >60)
Glucose, Bld: 104 mg/dL — ABNORMAL HIGH (ref 70–99)
Glucose, Bld: 130 mg/dL — ABNORMAL HIGH (ref 70–99)
Potassium: 3.8 mmol/L (ref 3.5–5.1)
Potassium: 4.3 mmol/L (ref 3.5–5.1)
Sodium: 137 mmol/L (ref 135–145)
Sodium: 133 mmol/L — ABNORMAL LOW (ref 135–145)
Total Bilirubin: 0.7 mg/dL (ref 0.3–1.2)
Total Bilirubin: 1.4 mg/dL — ABNORMAL HIGH (ref 0.3–1.2)
Total Protein: 7.4 g/dL (ref 6.5–8.1)
Total Protein: 6 g/dL — ABNORMAL LOW (ref 6.5–8.1)

## 2022-09-19 LAB — URINALYSIS, MICROSCOPIC (REFLEX)
Bacteria, UA: NONE SEEN
RBC / HPF: >50 RBC/hpf (ref 0–5)

## 2022-09-19 LAB — URINALYSIS, ROUTINE W REFLEX MICROSCOPIC
Bilirubin Urine: NEGATIVE
Glucose, UA: NEGATIVE mg/dL
Ketones, ur: NEGATIVE mg/dL
Leukocytes,Ua: NEGATIVE
Nitrite: NEGATIVE
Protein, ur: NEGATIVE mg/dL
Specific Gravity, Urine: 1.02 (ref 1.005–1.030)
pH: 6.5 (ref 5.0–8.0)

## 2022-09-19 LAB — ETHANOL: Alcohol, Ethyl (B): <10 mg/dL (ref <10)

## 2022-09-19 LAB — SAMPLE TO BLOOD BANK

## 2022-09-19 LAB — I-STAT CHEM 8, ED
BUN: 18 mg/dL (ref 6–20)
Calcium, Ion: 1.1 mmol/L — ABNORMAL LOW (ref 1.15–1.40)
Chloride: 102 mmol/L (ref 98–111)
Creatinine, Ser: 1.3 mg/dL — ABNORMAL HIGH (ref 0.61–1.24)
Glucose, Bld: 101 mg/dL — ABNORMAL HIGH (ref 70–99)
HCT: 40 % (ref 39.0–52.0)
Hemoglobin: 13.6 g/dL (ref 13.0–17.0)
Potassium: 3.7 mmol/L (ref 3.5–5.1)
Sodium: 139 mmol/L (ref 135–145)
TCO2: 26 mmol/L (ref 22–32)

## 2022-09-19 LAB — I-STAT CG4 LACTIC ACID, ED: Lactic Acid, Venous: 1.4 mmol/L (ref 0.5–1.9)

## 2022-09-19 LAB — PROTIME-INR
INR: 1 (ref 0.8–1.2)
Prothrombin Time: 13.7 s (ref 11.4–15.2)

## 2022-09-19 SURGERY — INSERTION, INTRAMEDULLARY ROD, TIBIA
Anesthesia: General | Laterality: Left

## 2022-09-19 MED ORDER — ONDANSETRON HCL 4 MG/2ML IJ SOLN
4.0000 mg | Freq: Four times a day (QID) | INTRAMUSCULAR | Status: DC | PRN
  Administered 2022-09-23: 4 mg via INTRAVENOUS
  Filled 2022-09-19: qty 2

## 2022-09-19 MED ORDER — POTASSIUM CHLORIDE IN NACL 20-0.9 MEQ/L-% IV SOLN
INTRAVENOUS | Status: DC
  Filled 2022-09-19: qty 1000

## 2022-09-19 MED ORDER — MIDAZOLAM HCL 2 MG/2ML IJ SOLN
INTRAMUSCULAR | Status: AC
  Filled 2022-09-19: qty 2

## 2022-09-19 MED ORDER — METHOCARBAMOL 1000 MG/10ML IJ SOLN
1000.0000 mg | Freq: Three times a day (TID) | INTRAVENOUS | Status: DC
  Filled 2022-09-19: qty 10

## 2022-09-19 MED ORDER — CEFAZOLIN SODIUM-DEXTROSE 2-4 GM/100ML-% IV SOLN
2.0000 g | Freq: Three times a day (TID) | INTRAVENOUS | Status: DC
  Filled 2022-09-19: qty 100

## 2022-09-19 MED ORDER — SODIUM CHLORIDE 0.9 % IR SOLN
Status: DC | PRN
Start: 2022-09-19 — End: 2022-09-19
  Administered 2022-09-19 (×2): 3000 mL

## 2022-09-19 MED ORDER — ORAL CARE MOUTH RINSE: 15.0000 mL | Freq: Once | OROMUCOSAL | Status: AC

## 2022-09-19 MED ORDER — LACTATED RINGERS IV SOLN: INTRAVENOUS | Status: DC

## 2022-09-19 MED ORDER — ENOXAPARIN SODIUM 40 MG/0.4ML IJ SOSY
40.0000 mg | PREFILLED_SYRINGE | INTRAMUSCULAR | Status: DC
  Administered 2022-09-20 – 2022-09-23 (×4): 40 mg via SUBCUTANEOUS
  Filled 2022-09-19 (×4): qty 0.4

## 2022-09-19 MED ORDER — ONDANSETRON HCL 4 MG/2ML IJ SOLN
INTRAMUSCULAR | Status: DC | PRN
Start: 2022-09-19 — End: 2022-09-19
  Administered 2022-09-19: 4 mg via INTRAVENOUS

## 2022-09-19 MED ORDER — METHOCARBAMOL 500 MG PO TABS
1000.0000 mg | ORAL_TABLET | Freq: Three times a day (TID) | ORAL | Status: DC
  Administered 2022-09-19 – 2022-09-23 (×12): 1000 mg via ORAL
  Filled 2022-09-19 (×13): qty 2

## 2022-09-19 MED ORDER — CEFAZOLIN SODIUM-DEXTROSE 2-4 GM/100ML-% IV SOLN
2.0000 g | INTRAVENOUS | Status: AC
  Administered 2022-09-19: 2 g via INTRAVENOUS

## 2022-09-19 MED ORDER — MIDAZOLAM HCL 2 MG/2ML IJ SOLN
INTRAMUSCULAR | Status: DC | PRN
  Administered 2022-09-19: 2 mg via INTRAVENOUS

## 2022-09-19 MED ORDER — OXYCODONE HCL 5 MG/5ML PO SOLN: 5.0000 mg | Freq: Once | ORAL | Status: DC | PRN

## 2022-09-19 MED ORDER — FENTANYL CITRATE PF 50 MCG/ML IJ SOSY
50.0000 ug | PREFILLED_SYRINGE | Freq: Once | INTRAMUSCULAR | Status: AC
  Administered 2022-09-19: 50 ug via INTRAVENOUS

## 2022-09-19 MED ORDER — CHLORHEXIDINE GLUCONATE 0.12 % MT SOLN
15.0000 mL | Freq: Once | OROMUCOSAL | Status: AC
  Administered 2022-09-19: 15 mL via OROMUCOSAL

## 2022-09-19 MED ORDER — METOCLOPRAMIDE HCL 5 MG PO TABS: 5.0000 mg | ORAL_TABLET | Freq: Three times a day (TID) | ORAL | Status: DC | PRN

## 2022-09-19 MED ORDER — OXYCODONE HCL 5 MG PO TABS
5.0000 mg | ORAL_TABLET | ORAL | Status: DC | PRN
  Administered 2022-09-21 – 2022-09-22 (×3): 10 mg via ORAL
  Filled 2022-09-19 (×4): qty 2

## 2022-09-19 MED ORDER — ONDANSETRON HCL 4 MG PO TABS: 4.0000 mg | ORAL_TABLET | Freq: Four times a day (QID) | ORAL | Status: DC | PRN

## 2022-09-19 MED ORDER — MAGNESIUM CITRATE PO SOLN: 1.0000 | Freq: Once | ORAL | Status: DC | PRN

## 2022-09-19 MED ORDER — ACETAMINOPHEN 500 MG PO TABS
1000.0000 mg | ORAL_TABLET | Freq: Four times a day (QID) | ORAL | Status: DC
  Administered 2022-09-19 – 2022-09-20 (×3): 1000 mg via ORAL
  Filled 2022-09-19 (×3): qty 2

## 2022-09-19 MED ORDER — PROPOFOL 10 MG/ML IV BOLUS
INTRAVENOUS | Status: AC
  Filled 2022-09-19: qty 20

## 2022-09-19 MED ORDER — MIDAZOLAM HCL 2 MG/2ML IJ SOLN
2.0000 mg | Freq: Once | INTRAMUSCULAR | Status: AC
  Administered 2022-09-19: 2 mg via INTRAVENOUS

## 2022-09-19 MED ORDER — FENTANYL CITRATE PF 50 MCG/ML IJ SOSY
PREFILLED_SYRINGE | INTRAMUSCULAR | Status: AC
  Filled 2022-09-19: qty 2

## 2022-09-19 MED ORDER — IOHEXOL 350 MG/ML SOLN
75.0000 mL | Freq: Once | INTRAVENOUS | Status: AC | PRN
  Administered 2022-09-19: 75 mL via INTRAVENOUS

## 2022-09-19 MED ORDER — HYDROMORPHONE HCL 1 MG/ML IJ SOLN
1.0000 mg | Freq: Once | INTRAMUSCULAR | Status: AC
  Administered 2022-09-19: 1 mg via INTRAVENOUS
  Filled 2022-09-19: qty 1

## 2022-09-19 MED ORDER — PHENYLEPHRINE HCL-NACL 20-0.9 MG/250ML-% IV SOLN
INTRAVENOUS | Status: DC | PRN
  Administered 2022-09-19: 25 ug/min via INTRAVENOUS

## 2022-09-19 MED ORDER — HYDROMORPHONE HCL 1 MG/ML IJ SOLN
INTRAMUSCULAR | Status: AC
  Administered 2022-09-19: 0.5 mg via INTRAVENOUS
  Filled 2022-09-19: qty 1

## 2022-09-19 MED ORDER — PROPOFOL 10 MG/ML IV BOLUS
INTRAVENOUS | Status: DC | PRN
  Administered 2022-09-19: 150 mg via INTRAVENOUS

## 2022-09-19 MED ORDER — SUGAMMADEX SODIUM 200 MG/2ML IV SOLN
INTRAVENOUS | Status: DC | PRN
Start: 2022-09-19 — End: 2022-09-19
  Administered 2022-09-19: 200 mg via INTRAVENOUS

## 2022-09-19 MED ORDER — METOCLOPRAMIDE HCL 5 MG/ML IJ SOLN: 5.0000 mg | Freq: Three times a day (TID) | INTRAMUSCULAR | Status: DC | PRN

## 2022-09-19 MED ORDER — HYDROMORPHONE HCL 1 MG/ML IJ SOLN
0.5000 mg | INTRAMUSCULAR | Status: DC | PRN
  Administered 2022-09-19: 0.5 mg via INTRAVENOUS

## 2022-09-19 MED ORDER — PHENYLEPHRINE 80 MCG/ML (10ML) SYRINGE FOR IV PUSH (FOR BLOOD PRESSURE SUPPORT)
PREFILLED_SYRINGE | INTRAVENOUS | Status: DC | PRN
  Administered 2022-09-19 (×2): 80 ug via INTRAVENOUS

## 2022-09-19 MED ORDER — FENTANYL CITRATE (PF) 100 MCG/2ML IJ SOLN: 25.0000 ug | INTRAMUSCULAR | Status: DC | PRN

## 2022-09-19 MED ORDER — 0.9 % SODIUM CHLORIDE (POUR BTL) OPTIME
TOPICAL | Status: DC | PRN
  Administered 2022-09-19: 1000 mL

## 2022-09-19 MED ORDER — ALBUMIN HUMAN 5 % IV SOLN
INTRAVENOUS | Status: DC | PRN
Start: 2022-09-19 — End: 2022-09-19

## 2022-09-19 MED ORDER — DOCUSATE SODIUM 100 MG PO CAPS
100.0000 mg | ORAL_CAPSULE | Freq: Two times a day (BID) | ORAL | Status: DC
  Administered 2022-09-19 – 2022-09-23 (×7): 100 mg via ORAL
  Filled 2022-09-19 (×9): qty 1

## 2022-09-19 MED ORDER — ACETAMINOPHEN 10 MG/ML IV SOLN: 1000.0000 mg | Freq: Once | INTRAVENOUS | Status: DC | PRN

## 2022-09-19 MED ORDER — ACETAMINOPHEN 325 MG PO TABS
325.0000 mg | ORAL_TABLET | Freq: Four times a day (QID) | ORAL | Status: DC | PRN
  Administered 2022-09-20 – 2022-09-21 (×3): 650 mg via ORAL
  Filled 2022-09-19 (×3): qty 2

## 2022-09-19 MED ORDER — FENTANYL CITRATE (PF) 250 MCG/5ML IJ SOLN
INTRAMUSCULAR | Status: DC | PRN
  Administered 2022-09-19: 100 ug via INTRAVENOUS
  Administered 2022-09-19 (×3): 50 ug via INTRAVENOUS

## 2022-09-19 MED ORDER — SODIUM CHLORIDE 0.9 % IV SOLN: INTRAVENOUS | Status: DC

## 2022-09-19 MED ORDER — LIDOCAINE 2% (20 MG/ML) 5 ML SYRINGE
INTRAMUSCULAR | Status: DC | PRN
  Administered 2022-09-19: 100 mg via INTRAVENOUS

## 2022-09-19 MED ORDER — FENTANYL CITRATE (PF) 250 MCG/5ML IJ SOLN
INTRAMUSCULAR | Status: AC
  Filled 2022-09-19: qty 5

## 2022-09-19 MED ORDER — SODIUM CHLORIDE 0.9 % IV BOLUS
1000.0000 mL | Freq: Once | INTRAVENOUS | Status: AC
  Administered 2022-09-19: 1000 mL via INTRAVENOUS

## 2022-09-19 MED ORDER — OXYCODONE HCL 5 MG PO TABS: 5.0000 mg | ORAL_TABLET | Freq: Once | ORAL | Status: DC | PRN

## 2022-09-19 MED ORDER — POLYETHYLENE GLYCOL 3350 17 G PO PACK
17.0000 g | PACK | Freq: Every day | ORAL | Status: DC
  Administered 2022-09-19 – 2022-09-23 (×5): 17 g via ORAL
  Filled 2022-09-19 (×5): qty 1

## 2022-09-19 MED ORDER — HYDROMORPHONE HCL 1 MG/ML IJ SOLN
0.5000 mg | INTRAMUSCULAR | Status: DC | PRN
  Administered 2022-09-19 – 2022-09-23 (×5): 1 mg via INTRAVENOUS
  Filled 2022-09-19 (×5): qty 1

## 2022-09-19 MED ORDER — SODIUM CHLORIDE 0.9 % IV SOLN
2.0000 g | INTRAVENOUS | Status: AC
  Administered 2022-09-19 – 2022-09-21 (×3): 2 g via INTRAVENOUS
  Filled 2022-09-19 (×3): qty 20

## 2022-09-19 MED ORDER — TETANUS-DIPHTH-ACELL PERTUSSIS 5-2.5-18.5 LF-MCG/0.5 IM SUSY
0.5000 mL | PREFILLED_SYRINGE | Freq: Once | INTRAMUSCULAR | Status: AC
  Administered 2022-09-19: 0.5 mL via INTRAMUSCULAR

## 2022-09-19 MED ORDER — HYDROMORPHONE HCL 1 MG/ML IJ SOLN
1.0000 mg | INTRAMUSCULAR | Status: DC | PRN
  Administered 2022-09-19 (×2): 1 mg via INTRAVENOUS
  Filled 2022-09-19 (×2): qty 1

## 2022-09-19 MED ORDER — ROCURONIUM BROMIDE 10 MG/ML (PF) SYRINGE
PREFILLED_SYRINGE | INTRAVENOUS | Status: DC | PRN
  Administered 2022-09-19: 100 mg via INTRAVENOUS
  Administered 2022-09-19: 20 mg via INTRAVENOUS

## 2022-09-19 MED ORDER — OXYCODONE HCL 5 MG PO TABS
10.0000 mg | ORAL_TABLET | ORAL | Status: DC | PRN
  Administered 2022-09-19 – 2022-09-20 (×6): 15 mg via ORAL
  Administered 2022-09-22 – 2022-09-23 (×2): 10 mg via ORAL
  Administered 2022-09-23 (×2): 15 mg via ORAL
  Filled 2022-09-19: qty 2
  Filled 2022-09-19 (×8): qty 3

## 2022-09-19 MED ORDER — DEXAMETHASONE SODIUM PHOSPHATE 10 MG/ML IJ SOLN
INTRAMUSCULAR | Status: DC | PRN
  Administered 2022-09-19: 10 mg via INTRAVENOUS

## 2022-09-19 MED ORDER — ONDANSETRON HCL 4 MG/2ML IJ SOLN: 4.0000 mg | Freq: Once | INTRAMUSCULAR | Status: DC | PRN

## 2022-09-19 SURGICAL SUPPLY — 67 items
BAG COUNTER SPONGE SURGICOUNT (BAG) ×1 IMPLANT
BAG SPNG CNTER NS LX DISP (BAG) ×1
BIT DRILL 1.5 (BIT) IMPLANT
BIT DRILL 3-FLUTE 4.2X145 (BIT) IMPLANT
BIT DRILL LONG 4.2 (BIT) IMPLANT
BLADE SURG 10 STRL SS (BLADE) ×1 IMPLANT
BNDG CMPR MD 5X2 ELC HKLP STRL (GAUZE/BANDAGES/DRESSINGS) ×2
BNDG ELASTIC 2 VLCR STRL LF (GAUZE/BANDAGES/DRESSINGS) IMPLANT
BNDG GAUZE DERMACEA FLUFF 4 (GAUZE/BANDAGES/DRESSINGS) IMPLANT
BNDG GZE DERMACEA 4 6PLY (GAUZE/BANDAGES/DRESSINGS) ×1
BRUSH SCRUB EZ PLAIN DRY (MISCELLANEOUS) ×2 IMPLANT
COVER MAYO STAND STRL (DRAPES) IMPLANT
COVER SURGICAL LIGHT HANDLE (MISCELLANEOUS) ×2 IMPLANT
DRAPE C-ARM 42X72 X-RAY (DRAPES) ×1 IMPLANT
DRAPE C-ARMOR (DRAPES) ×1 IMPLANT
DRILL BIT 1.5 (BIT) ×2
DRILL BIT 3-FLUTE 4.2X145 (BIT) ×1
DRSG MEPITEL 4X7.2 (GAUZE/BANDAGES/DRESSINGS) IMPLANT
ELECT REM PT RETURN 9FT ADLT (ELECTROSURGICAL) ×1
ELECTRODE REM PT RTRN 9FT ADLT (ELECTROSURGICAL) ×1 IMPLANT
GAUZE SPONGE 4X4 12PLY STRL (GAUZE/BANDAGES/DRESSINGS) IMPLANT
GLOVE BIO SURGEON STRL SZ7.5 (GLOVE) ×1 IMPLANT
GLOVE BIO SURGEON STRL SZ8 (GLOVE) ×1 IMPLANT
GLOVE BIOGEL PI IND STRL 7.5 (GLOVE) ×1 IMPLANT
GLOVE BIOGEL PI IND STRL 8 (GLOVE) ×1 IMPLANT
GLOVE SURG ORTHO LTX SZ7.5 (GLOVE) ×2 IMPLANT
GOWN STRL REUS W/ TWL XL LVL3 (GOWN DISPOSABLE) ×1 IMPLANT
GOWN STRL REUS W/TWL XL LVL3 (GOWN DISPOSABLE) ×4
GUIDEWIRE 3.2X400 (WIRE) IMPLANT
HANDPIECE INTERPULSE COAX TIP (DISPOSABLE) ×1
KIT BASIN OR (CUSTOM PROCEDURE TRAY) ×1 IMPLANT
KIT INFUSE LRG II (Orthopedic Implant) IMPLANT
KIT TURNOVER KIT B (KITS) ×1 IMPLANT
NAIL TIB ADV 9X390 (Nail) IMPLANT
PACK ORTHO EXTREMITY (CUSTOM PROCEDURE TRAY) ×1 IMPLANT
PAD ABD 8X10 STRL (GAUZE/BANDAGES/DRESSINGS) IMPLANT
PAD ARMBOARD 7.5X6 YLW CONV (MISCELLANEOUS) ×2 IMPLANT
PAD CAST 4YDX4 CTTN HI CHSV (CAST SUPPLIES) ×1 IMPLANT
PADDING CAST COTTON 4X4 STRL (CAST SUPPLIES) ×1
PADDING CAST COTTON 6X4 STRL (CAST SUPPLIES) ×1 IMPLANT
PENCIL BUTTON HOLSTER BLD 10FT (ELECTRODE) IMPLANT
PLATE LCP 7H 52MM 2.0MM (Plate) IMPLANT
REAMER ROD DEEP FLUTE 2.5X950 (INSTRUMENTS) IMPLANT
SCREW CORTEX SLFTPNG 8MM (Screw) IMPLANT
SCREW LOCK IM 68X5X IM NL (Spacer) IMPLANT
SCREW LOCK IM NAIL 5.0X34 (Screw) IMPLANT
SCREW LOCK IM NAIL 5X68 (Spacer) ×1 IMPLANT
SCREW LOCK IM NAIL 5X70 (Screw) IMPLANT
SCREW LOCK IM TI 5X40 (Screw) IMPLANT
SET HNDPC FAN SPRY TIP SCT (DISPOSABLE) IMPLANT
SPONGE T-LAP 18X18 ~~LOC~~+RFID (SPONGE) IMPLANT
STAPLER VISISTAT 35W (STAPLE) ×1 IMPLANT
SUT ETHILON 1 TP 1 60 (SUTURE) IMPLANT
SUT ETHILON 2 0 PSLX (SUTURE) IMPLANT
SUT PDS AB 2-0 CT1 27 (SUTURE) IMPLANT
SUT PROLENE 6 0 BV (SUTURE) IMPLANT
SUT SILK 2 0 (SUTURE) ×1
SUT SILK 2-0 18XBRD TIE 12 (SUTURE) IMPLANT
SUT SILK 3 0 (SUTURE) ×2
SUT SILK 3-0 18XBRD TIE 12 (SUTURE) IMPLANT
SUT VIC AB 0 CT1 27 (SUTURE) ×1
SUT VIC AB 0 CT1 27XBRD ANBCTR (SUTURE) IMPLANT
SUT VIC AB 2-0 CT1 27 (SUTURE) ×1
SUT VIC AB 2-0 CT1 TAPERPNT 27 (SUTURE) ×1 IMPLANT
TOWEL GREEN STERILE (TOWEL DISPOSABLE) ×2 IMPLANT
TUBE CONNECTING 12X1/4 (SUCTIONS) IMPLANT
YANKAUER SUCT BULB TIP NO VENT (SUCTIONS) IMPLANT
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
## 2022-09-19 NOTE — Plan of Care (Signed)

## 2022-09-19 NOTE — Progress Notes (Signed)
   09/19/22 0317  Spiritual Encounters  Type of Visit Initial  Care provided to: Patient  Referral source Trauma page  Reason for visit Trauma  OnCall Visit Yes  Advance Directives (For Healthcare)  Does Patient Have a Medical Advance Directive? No  Would patient like information on creating a medical advance directive? No - Patient declined  Mental Health Advance Directives  Does Patient Have a Mental Health Advance Directive? No  Would patient like information on creating a mental health advance directive? No - Patient declined   Ch responded to trauma level II page. There was no family at bedside. Pt wanted Ch to call his sister [place] C. [phone] but she did not answer the phone. Pt left a text message informing sister he is at [facility]. Ch remains available when needed.  Chaplain Val Saturnino Liew, M.Div.

## 2022-09-19 NOTE — Anesthesia Postprocedure Evaluation (Signed)
Anesthesia Post Note  Patient: Daniel Spence  Procedure(s) Performed: INTRAMEDULLARY (IM) NAIL TIBIAL WITH  IRRIGATION AND DEBRIDMENT OF TIBIA (Left) WOUND EXPLORATION left leg with ligation of saphenous and tibial vein (Left)     Patient location during evaluation: PACU Anesthesia Type: General Level of consciousness: awake and alert Pain management: pain level controlled Vital Signs Assessment: post-procedure vital signs reviewed and stable Respiratory status: spontaneous breathing, nonlabored ventilation, respiratory function stable and patient connected to nasal cannula oxygen Cardiovascular status: blood pressure returned to baseline and stable Postop Assessment: no apparent nausea or vomiting Anesthetic complications: no   No notable events documented.  Last Vitals:  Vitals:   09/19/22 1730 09/19/22 1756  BP: (!) 140/94 134/82  Pulse: 72 91  Resp: 12 14  Temp:  36.6 C  SpO2: 97% 98%    Last Pain:  Vitals:   09/19/22 1756  TempSrc: Oral  PainSc:                  Mariann Barter

## 2022-09-19 NOTE — Progress Notes (Signed)
Orthopedic Tech Progress Note Patient Details:  MARGUES EXUME [birth year] [id number]  Ortho Devices Type of Ortho Device: CAM walker Ortho Device/Splint Location: LLE Ortho Device/Splint Interventions: Application, Ordered, Adjustment   Post Interventions Patient Tolerated: Well Instructions Provided: Care of device  Daniel Spence Daniel Spence 09/19/2022, 4:44 PM
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
## 2022-09-19 NOTE — Progress Notes (Signed)
Unable to reach family contact to communicate that surgery went well. First number for sister rang but VM full and the second number only beeped.  Please notify if other contact numbers become available.  Myrene Galas, MD [facility], [facility] [phone]
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
## 2022-09-19 NOTE — ED Notes (Signed)
Surgical Consent is signed and dated by patient and nurse at bedside.

## 2022-09-19 NOTE — Anesthesia Procedure Notes (Signed)
Procedure Name: Intubation Date/Time: 09/19/2022 1:02 PM  Performed by: Dairl Ponder, CRNAPre-anesthesia Checklist: Patient identified, Emergency Drugs available, Suction available and Patient being monitored Patient Re-evaluated:Patient Re-evaluated prior to induction Oxygen Delivery Method: Circle System Utilized Preoxygenation: Pre-oxygenation with 100% oxygen Induction Type: IV induction Ventilation: Mask ventilation without difficulty Laryngoscope Size: Mac and 4 Grade View: Grade II Tube type: Oral Tube size: 7.5 mm Number of attempts: 1 Airway Equipment and Method: Stylet and Oral airway Placement Confirmation: ETT inserted through vocal cords under direct vision, positive ETCO2 and breath sounds checked- equal and bilateral Secured at: 24 cm Tube secured with: Tape Dental Injury: Teeth and Oropharynx as per pre-operative assessment
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
## 2022-09-19 NOTE — Anesthesia Preprocedure Evaluation (Signed)
Anesthesia Evaluation  Patient identified by MRN, date of birth, ID band Patient awake    Reviewed: Allergy & Precautions, NPO status , Patient's Chart, lab work & pertinent test results, reviewed documented beta blocker date and time   History of Anesthesia Complications Negative for: history of anesthetic complications  Airway Mallampati: II  TM Distance: >3 FB Neck ROM: Full    Dental no notable dental hx.    Pulmonary neg shortness of breath, neg COPD, Current Smoker   breath sounds clear to auscultation       Cardiovascular hypertension, (-) angina (-) CAD, (-) Past MI, (-) Cardiac Stents, (-) CABG and (-) Peripheral Vascular Disease (-) dysrhythmias  Rhythm:Regular Rate:Normal     Neuro/Psych neg Seizures    GI/Hepatic ,neg GERD  ,,(+) neg Cirrhosis        Endo/Other  neg diabetes    Renal/GU Renal disease     Musculoskeletal   Abdominal   Peds  Hematology   Anesthesia Other Findings   Reproductive/Obstetrics                              Anesthesia Physical Anesthesia Plan  ASA: 2  Anesthesia Plan: General   Post-op Pain Management:    Induction: Intravenous and Rapid sequence  PONV Risk Score and Plan: Ondansetron  Airway Management Planned: Oral ETT  Additional Equipment:   Intra-op Plan:   Post-operative Plan: Extubation in OR  Informed Consent: I have reviewed the patients History and Physical, chart, labs and discussed the procedure including the risks, benefits and alternatives for the proposed anesthesia with the patient or authorized representative who has indicated his/her understanding and acceptance.     Dental advisory given  Plan Discussed with: CRNA  Anesthesia Plan Comments:          Anesthesia Quick Evaluation

## 2022-09-19 NOTE — ED Notes (Signed)
Patient given phone and spoke with his sister.
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
## 2022-09-19 NOTE — ED Notes (Signed)
PACU BAY 24

## 2022-09-19 NOTE — Transfer of Care (Signed)
Immediate Anesthesia Transfer of Care Note  Patient: Daniel Spence  Procedure(s) Performed: INTRAMEDULLARY (IM) NAIL TIBIAL WITH  IRRIGATION AND DEBRIDMENT OF TIBIA (Left) WOUND EXPLORATION left leg with ligation of saphenous and tibial vein (Left)  Patient Location: PACU  Anesthesia Type:General  Level of Consciousness: drowsy  Airway & Oxygen Therapy: Patient Spontanous Breathing  Post-op Assessment: Report given to RN and Post -op Vital signs reviewed and stable  Post vital signs: Reviewed and stable  Last Vitals:  Vitals Value Taken Time  BP    Temp    Pulse    Resp    SpO2      Last Pain:  Vitals:   09/19/22 1235  TempSrc:   PainSc: 8          Complications: No notable events documented.

## 2022-09-19 NOTE — ED Notes (Signed)
Reduction performed by EDP and splint applied by ortho tech

## 2022-09-20 LAB — CBC
HCT: 30.8 % — ABNORMAL LOW (ref 39.0–52.0)
Hemoglobin: 9.9 g/dL — ABNORMAL LOW (ref 13.0–17.0)
MCH: 28.6 pg (ref 26.0–34.0)
MCHC: 32.1 g/dL (ref 30.0–36.0)
MCV: 89 fL (ref 80.0–100.0)
Platelets: 306 10*3/uL (ref 150–400)
RBC: 3.46 MIL/uL — ABNORMAL LOW (ref 4.22–5.81)
RDW: 13.5 % (ref 11.5–15.5)
WBC: 10.6 10*3/uL — ABNORMAL HIGH (ref 4.0–10.5)
nRBC: 0 % (ref 0.0–0.2)

## 2022-09-20 LAB — VITAMIN D 25 HYDROXY (VIT D DEFICIENCY, FRACTURES): Vit D, 25-Hydroxy: 27.95 ng/mL — ABNORMAL LOW (ref 30–100)

## 2022-09-20 MED ORDER — KETOROLAC TROMETHAMINE 15 MG/ML IJ SOLN
15.0000 mg | Freq: Three times a day (TID) | INTRAMUSCULAR | Status: DC
  Administered 2022-09-20 – 2022-09-23 (×10): 15 mg via INTRAVENOUS
  Filled 2022-09-20 (×10): qty 1

## 2022-09-20 MED ORDER — VITAMIN C 500 MG PO TABS
1000.0000 mg | ORAL_TABLET | Freq: Every day | ORAL | Status: DC
  Administered 2022-09-20 – 2022-09-23 (×4): 1000 mg via ORAL
  Filled 2022-09-20 (×4): qty 2

## 2022-09-20 MED ORDER — VITAMIN D 25 MCG (1000 UNIT) PO TABS
2000.0000 [IU] | ORAL_TABLET | Freq: Two times a day (BID) | ORAL | Status: DC
  Administered 2022-09-20 – 2022-09-23 (×7): 2000 [IU] via ORAL
  Filled 2022-09-20 (×7): qty 2
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
## 2022-09-20 NOTE — TOC CAGE-AID Note (Signed)
Transition of Care [facility]) - CAGE-AID Screening   Patient Details  Name: Daniel Spence MRN: [id number] Date of Birth: Oct 04, 1990  [facility]) CM/SW Contact:    Leota Sauers, RN Phone Number: 09/20/2022, 11:54 PM   Clinical Narrative:  Patient reports he has ceased consumption of alcohol, endorses hx of marijuana use but report rarely using now. Resources not needed at this time.  CAGE-AID Screening:    Have You Ever Felt You Ought to Cut Down on Your Drinking or Drug Use?: No Have People Annoyed You By Critizing Your Drinking Or Drug Use?: No Have You Felt Bad Or Guilty About Your Drinking Or Drug Use?: No Have You Ever Had a Drink or Used Drugs First Thing In The Morning to Steady Your Nerves or to Get Rid of a Hangover?: No CAGE-AID Score: 0  Substance Abuse Education Offered: No
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
## 2022-09-20 NOTE — Progress Notes (Signed)
Orthopaedic Trauma Service (OTS)  1 Day Post-Op Procedure(s) (LRB): INTRAMEDULLARY (IM) NAIL TIBIAL WITH  IRRIGATION AND DEBRIDMENT OF TIBIA (Left) WOUND EXPLORATION left leg with ligation of saphenous and tibial vein (Left)  Subjective: Patient reports pain as moderate.  Much better than yesterday!   Objective: Current Vitals Blood pressure 117/66, pulse 84, temperature 99 F (37.2 C), temperature source Oral, resp. rate 18, height 6' 3.98" (1.93 m), weight 92 kg, SpO2 99%. Vital signs in last 24 hours: Temp:  [97.7 F (36.5 C)-99 F (37.2 C)] 99 F (37.2 C) (09/15 0819) Pulse Rate:  [68-95] 84 (09/15 0819) Resp:  [12-18] 18 (09/15 0819) BP: (106-152)/(64-96) 117/66 (09/15 0819) SpO2:  [97 %-100 %] 99 % (09/15 0819) Weight:  [92 kg] 92 kg (09/14 1137)  Intake/Output from previous day: 09/14 0701 - 09/15 0700 In: 2230 [P.O.:480; I.V.:1400; IV Piggyback:350] Out: 2050 [Urine:1950; Blood:100]  LABS Recent Labs    09/19/22 0245 09/19/22 0252 09/19/22 2054 09/20/22 0419  HGB 12.9* 13.6 10.9* 9.9*   Recent Labs    09/19/22 2054 09/20/22 0419  WBC 10.9* 10.6*  RBC 3.67* 3.46*  HCT 33.2* 30.8*  PLT 306 306   Recent Labs    09/19/22 0245 09/19/22 0252 09/19/22 2054  NA 137 139 133*  K 3.8 3.7 4.3  CL 101 102 99  CO2 24  --  26  BUN 16 18 11   CREATININE 1.32* 1.30* 0.97  GLUCOSE 104* 101* 130*  CALCIUM 9.1  --  8.7*   Recent Labs    09/19/22 0245  INR 1.0     Physical Exam  LLE  Dressing intact, clean, dry under CAM  Edema/ swelling controlled  Sens: DPN, SPN, TN intact  Motor: EHL, FHL, and lessor toe ext and flex all intact grossly  Brisk cap refill, warm to touch  Assessment/Plan: 1 Day Post-Op Procedure(s) (LRB): INTRAMEDULLARY (IM) NAIL TIBIAL WITH  IRRIGATION AND DEBRIDMENT OF TIBIA (Left) WOUND EXPLORATION left leg with ligation of saphenous and tibial vein (Left) 1. PT/OT  2. DVT proph Lovenox 3. F/u 8-14 days post discharge 4.  Discharge planning paramount given lack of housing  Myrene Galas, MD [facility], [facility] [phone]
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
## 2022-09-21 ENCOUNTER — Encounter (HOSPITAL_COMMUNITY): Payer: Self-pay | Admitting: Orthopedic Surgery

## 2022-09-21 LAB — CBC
HCT: 26.8 % — ABNORMAL LOW (ref 39.0–52.0)
Hemoglobin: 8.9 g/dL — ABNORMAL LOW (ref 13.0–17.0)
MCH: 30 pg (ref 26.0–34.0)
MCHC: 33.2 g/dL (ref 30.0–36.0)
MCV: 90.2 fL (ref 80.0–100.0)
Platelets: 235 10*3/uL (ref 150–400)
RBC: 2.97 MIL/uL — ABNORMAL LOW (ref 4.22–5.81)
RDW: 13.4 % (ref 11.5–15.5)
WBC: 8.9 10*3/uL (ref 4.0–10.5)
nRBC: 0 % (ref 0.0–0.2)

## 2022-09-21 LAB — COMPREHENSIVE METABOLIC PANEL
ALT: 52 U/L — ABNORMAL HIGH (ref 0–44)
AST: 50 U/L — ABNORMAL HIGH (ref 15–41)
Albumin: 2.7 g/dL — ABNORMAL LOW (ref 3.5–5.0)
Alkaline Phosphatase: 36 U/L — ABNORMAL LOW (ref 38–126)
Anion gap: 7 (ref 5–15)
BUN: 11 mg/dL (ref 6–20)
CO2: 29 mmol/L (ref 22–32)
Calcium: 8.5 mg/dL — ABNORMAL LOW (ref 8.9–10.3)
Chloride: 99 mmol/L (ref 98–111)
Creatinine, Ser: 0.98 mg/dL (ref 0.61–1.24)
GFR, Estimated: >60 mL/min (ref >60)
Glucose, Bld: 131 mg/dL — ABNORMAL HIGH (ref 70–99)
Potassium: 3.8 mmol/L (ref 3.5–5.1)
Sodium: 135 mmol/L (ref 135–145)
Total Bilirubin: 0.5 mg/dL (ref 0.3–1.2)
Total Protein: 5.6 g/dL — ABNORMAL LOW (ref 6.5–8.1)

## 2022-09-21 NOTE — Plan of Care (Signed)
  Problem: Education: Goal: Knowledge of General Education information will improve Description Including pain rating scale, medication(s)/side effects and non-pharmacologic comfort measures Outcome: Progressing   
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
## 2022-09-21 NOTE — Plan of Care (Signed)
Problem: Education: Goal: Knowledge of General Education information will improve Description: Including pain rating scale, medication(s)/side effects and non-pharmacologic comfort measures Outcome: Progressing   Problem: Health Behavior/Discharge Planning: Goal: Ability to manage health-related needs will improve Outcome: Progressing   Problem: Clinical Measurements: Goal: Ability to maintain clinical measurements within normal limits will improve Outcome: Progressing Goal: Will remain free from infection Outcome: Progressing   Problem: Activity: Goal: Risk for activity intolerance will decrease Outcome: Progressing   Problem: Coping: Goal: Level of anxiety will decrease Outcome: Progressing   Problem: Pain Managment: Goal: General experience of comfort will improve Outcome: Progressing   Problem: Skin Integrity: Goal: Risk for impaired skin integrity will decrease Outcome: Progressing
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
## 2022-09-22 NOTE — Progress Notes (Signed)
Mobility Specialist: Progress Note   09/22/22 1611  Mobility  Activity Ambulated with assistance in hallway  Level of Assistance Standby assist, set-up cues, supervision of patient - no hands on  Assistive Device Front wheel walker  Distance Ambulated (ft) 300 ft  LLE Weight Bearing WBAT  Activity Response Tolerated well  Mobility Referral Yes  $Mobility charge 1 Mobility  Mobility Specialist Start Time (ACUTE ONLY) 1136  Mobility Specialist Stop Time (ACUTE ONLY) 1151  Mobility Specialist Time Calculation (min) (ACUTE ONLY) 15 min    Pt was agreeable to mobility session - received ambulating in room. Co-treated with PT. Ind for bed mobility, modI for transfer. Ambulated to and from 5N gym with chair follow without fault. Completed stair practice with PT and then took 1x seated break (~30 secs) d/t fatigue. Had c/o LLE pain but stated it was tolerable as he had received pain medication prior to session. Returned to room without fault. Left in bed with all needs met, call bell in reach.   MS returned around 2pm as pt requested additional ambulation throughout the day. Ambulated in hallway without fault, distance limited d/t LLE pressure from CAM boot. Returned to room without fault. Left in bed with all needs met, call bell in reach.   Maurene Capes Mobility Specialist Please contact via SecureChat or Rehab office at [phone]

## 2022-09-23 ENCOUNTER — Other Ambulatory Visit (HOSPITAL_COMMUNITY): Payer: Self-pay

## 2022-09-23 MED ORDER — DOCUSATE SODIUM 100 MG PO CAPS
100.0000 mg | ORAL_CAPSULE | Freq: Two times a day (BID) | ORAL | 0 refills | Status: AC
Start: 2022-09-23 — End: ?
  Filled 2022-09-23: qty 20, 10d supply, fill #0

## 2022-09-23 MED ORDER — OXYCODONE-ACETAMINOPHEN 7.5-325 MG PO TABS
1.0000 | ORAL_TABLET | Freq: Three times a day (TID) | ORAL | 0 refills | Status: AC | PRN
Start: 2022-09-23 — End: ?
  Filled 2022-09-23: qty 40, 7d supply, fill #0

## 2022-09-23 MED ORDER — CHOLECALCIFEROL 125 MCG (5000 UT) PO TABS
5000.0000 [IU] | ORAL_TABLET | Freq: Every day | ORAL | 6 refills | Status: AC
Start: 2022-09-23 — End: ?
  Filled 2022-09-23: qty 30, 30d supply, fill #0

## 2022-09-23 MED ORDER — ASCORBIC ACID 1000 MG PO TABS
1000.0000 mg | ORAL_TABLET | Freq: Every day | ORAL | 1 refills | Status: AC
  Filled 2022-09-23: qty 30, 30d supply, fill #0

## 2022-09-23 MED ORDER — RIVAROXABAN 15 MG PO TABS
15.0000 mg | ORAL_TABLET | Freq: Every day | ORAL | 0 refills | Status: AC
Start: 2022-09-23 — End: 2022-10-23
  Filled 2022-09-23: qty 30, 30d supply, fill #0

## 2022-09-23 MED ORDER — METHOCARBAMOL 500 MG PO TABS
500.0000 mg | ORAL_TABLET | Freq: Four times a day (QID) | ORAL | 0 refills | Status: AC | PRN
  Filled 2022-09-23: qty 60, 8d supply, fill #0

## 2022-09-23 NOTE — Progress Notes (Signed)
PT Cancellation Note  Patient Details Name: Daniel Spence MRN: [id number] DOB: 19-Jan-1990   Cancelled Treatment:    Reason Eval/Treat Not Completed: Patient declined, no reason specified (Pt refused stating that he wasnt feeling it right now. Pt noted with DC orders today. Pt reinforced on education of stairs and WB precautions. Will follow tomorrow if still admitted.)   Gladys Damme 09/23/2022, 3:58 PM

## 2022-09-23 NOTE — Progress Notes (Signed)
Discharge instructions given. Patient verbalized understanding and all questions were answered.  ?

## 2022-09-23 NOTE — Discharge Instructions (Signed)
Orthopaedic Trauma Service Discharge Instructions   General Discharge Instructions  Orthopaedic Injuries:  Open Left tibia and fibula fracture treated with irrigation and debridement of open fractures and intramedullary nailing of left tibia  WEIGHT BEARING STATUS: Weightbearing as tolerated left leg with CAM boot and crutches  RANGE OF MOTION/ACTIVITY: unrestricted ROM L knee and ankle. Activity as tolerated   Bone health: labs show some vitamin d insufficiency.  Recommend vitamin d3 5000 IUs daily   Review the following resource for additional information regarding bone health  [email or web address]  Wound Care: daily wound care as needed starting on 09/25/2022. See below  Discharge Wound Care Instructions  Do NOT apply any ointments, solutions or lotions to pin sites or surgical wounds.  These prevent needed drainage and even though solutions like hydrogen peroxide kill bacteria, they also damage cells lining the pin sites that help fight infection.  Applying lotions or ointments can keep the wounds moist and can cause them to breakdown and open up as well. This can increase the risk for infection. When in doubt call the office.  Surgical incisions should be dressed daily.  If any drainage is noted, use one layer of adaptic or Mepitel, then gauze, Kerlix, and an ace wrap.  [email or web address][employer]cz [email or web address]_[email or web address]&[email or web address]  [email or web address]  These dressing supplies should be available at local medical supply stores ([employer], [employer], etc). They are not usually carried at places like [facility], [facility], [facility], etc  Once the incision is completely dry and without drainage, it may be left open to air out.  Showering may begin 36-48 hours  later.  Cleaning gently with soap and water.  Traumatic wounds should be dressed daily as well.    One layer of adaptic, gauze, Kerlix, then ace wrap.  The adaptic can be discontinued once the draining has ceased    If you have a wet to dry dressing: wet the gauze with saline the squeeze as much saline out so the gauze is moist (not soaking wet), place moistened gauze over wound, then place a dry gauze over the moist one, followed by Kerlix wrap, then ace wrap.  DVT/PE prophylaxis: xarelto 15 mg daily x 30 days for blood clot prevention   Diet: as you were eating previously.  Can use over the counter stool softeners and bowel preparations, such as Miralax, to help with bowel movements.  Narcotics can be constipating.  Be sure to drink plenty of fluids  PAIN MEDICATION USE AND EXPECTATIONS  You have likely been given narcotic medications to help control your pain.  After a traumatic event that results in an fracture (broken bone) with or without surgery, it is ok to use narcotic pain medications to help control one's pain.  We understand that everyone responds to pain differently and each individual patient will be evaluated on a regular basis for the continued need for narcotic medications. Ideally, narcotic medication use should last no more than 6-8 weeks (coinciding with fracture healing).   As a patient it is your responsibility as well to monitor narcotic medication use and report the amount and frequency you use these medications when you come to your office visit.   We would also advise that if you are using narcotic medications, you should take a dose prior to therapy to maximize you participation.  IF YOU ARE ON NARCOTIC MEDICATIONS IT IS NOT PERMISSIBLE TO OPERATE A MOTOR VEHICLE (MOTORCYCLE/CAR/TRUCK/MOPED) OR HEAVY MACHINERY DO NOT MIX NARCOTICS WITH OTHER CNS (CENTRAL NERVOUS SYSTEM) DEPRESSANTS SUCH AS ALCOHOL  POST-OPERATIVE OPIOID TAPER INSTRUCTIONS: It is important to wean off  of your opioid medication as soon as possible. If you do not need pain medication after your surgery it is ok to stop day one. Opioids include: Codeine, Hydrocodone(Norco, Vicodin), Oxycodone(Percocet, oxycontin) and hydromorphone amongst others.  Long term and even short term use of opiods can cause: Increased pain response Dependence Constipation Depression Respiratory depression And more.  Withdrawal symptoms can include Flu like symptoms Nausea, vomiting And more Techniques to manage these symptoms Hydrate well Eat regular healthy meals Stay active Use relaxation techniques(deep breathing, meditating, yoga) Do Not substitute Alcohol to help with tapering If you have been on opioids for less than two weeks and do not have pain than it is ok to stop all together.  Plan to wean off of opioids This plan should start within one week post op of your fracture surgery  Maintain the same interval or time between taking each dose and first decrease the dose.  Cut the total daily intake of opioids by one tablet each day Next start to increase the time between doses. The last dose that should be eliminated is the evening dose.    STOP SMOKING OR USING NICOTINE PRODUCTS!!!!  As discussed nicotine severely impairs your body's ability to heal surgical and traumatic wounds but also impairs bone healing.  Wounds and bone heal by forming microscopic blood vessels (angiogenesis) and nicotine is a vasoconstrictor (essentially, shrinks blood vessels).  Therefore, if vasoconstriction occurs to these microscopic blood vessels they essentially disappear and are unable to deliver necessary nutrients to the healing tissue.  This is one modifiable factor that you can do to dramatically increase your chances of healing your injury.    (This means no smoking, no nicotine gum, patches, etc)  DO NOT USE NONSTEROIDAL ANTI-INFLAMMATORY DRUGS (NSAID'S)  Using products such as Advil (ibuprofen), Aleve (naproxen),  Motrin (ibuprofen) for additional pain control during fracture healing can delay and/or prevent the healing response.  If you would like to take over the counter (OTC) medication, Tylenol (acetaminophen) is ok.  However, some narcotic medications that are given for pain control contain acetaminophen as well. Therefore, you should not exceed more than 4000 mg of tylenol in a day if you do not have liver disease.  Also note that there are may OTC medicines, such as cold medicines and allergy medicines that my contain tylenol as well.  If you have any questions about medications and/or interactions please ask your doctor/PA or your pharmacist.      ICE AND ELEVATE INJURED/OPERATIVE EXTREMITY  Using ice and elevating the injured extremity above your heart can help with swelling and pain control.  Icing in a pulsatile fashion, such as 20 minutes on and 20 minutes off, can be followed.    Do not place ice directly on skin. Make sure there is a barrier between to skin and the ice pack.    Using frozen items such as frozen peas works well as the conform nicely to the are that needs to be iced.  USE AN ACE WRAP OR TED HOSE FOR SWELLING CONTROL  In addition to icing and elevation, Ace wraps or TED hose are used to help limit and resolve swelling.  It is recommended to use Ace wraps or TED hose until you are informed to stop.    When using Ace Wraps start the wrapping distally (farthest away from the body) and wrap proximally (closer to the body)   Example: If you had surgery  on your leg or thing and you do not have a splint on, start the ace wrap at the toes and work your way up to the thigh        If you had surgery on your upper extremity and do not have a splint on, start the ace wrap at your fingers and work your way up to the upper arm  IF YOU ARE IN A SPLINT OR CAST DO NOT REMOVE IT FOR ANY REASON   If your splint gets wet for any reason please contact the office immediately. You may shower in your splint  or cast as long as you keep it dry.  This can be done by wrapping in a cast cover or garbage back (or similar)  Do Not stick any thing down your splint or cast such as pencils, money, or hangers to try and scratch yourself with.  If you feel itchy take benadryl as prescribed on the bottle for itching  IF YOU ARE IN A CAM BOOT (BLACK BOOT)  You may remove boot periodically. Perform daily dressing changes as noted below.  Wash the liner of the boot regularly and wear a sock when wearing the boot. It is recommended that you sleep in the boot until told otherwise    Call office for the following: Temperature greater than 101F Persistent nausea and vomiting Severe uncontrolled pain Redness, tenderness, or signs of infection (pain, swelling, redness, odor or green/yellow discharge around the site) Difficulty breathing, headache or visual disturbances Hives Persistent dizziness or light-headedness Extreme fatigue Any other questions or concerns you may have after discharge  In an emergency, call 911 or go to an Emergency Department at a nearby hospital  HELPFUL INFORMATION  If you had a block, it will wear off between 8-24 hrs postop typically.  This is period when your pain may go from nearly zero to the pain you would have had postop without the block.  This is an abrupt transition but nothing dangerous is happening.  You may take an extra dose of narcotic when this happens.  You should wean off your narcotic medicines as soon as you are able.  Most patients will be off or using minimal narcotics before their first postop appointment.   We suggest you use the pain medication the first night prior to going to bed, in order to ease any pain when the anesthesia wears off. You should avoid taking pain medications on an empty stomach as it will make you nauseous.  Do not drink alcoholic beverages or take illicit drugs when taking pain medications.  In most states it is against the law to drive  while you are in a splint or sling.  And certainly against the law to drive while taking narcotics.  You may return to work/school in the next couple of days when you feel up to it.   Pain medication may make you constipated.  Below are a few solutions to try in this order: Decrease the amount of pain medication if you aren't having pain. Drink lots of decaffeinated fluids. Drink prune juice and/or each dried prunes  If the first 3 don't work start with additional solutions Take Colace - an over-the-counter stool softener Take Senokot - an over-the-counter laxative Take Miralax - a stronger over-the-counter laxative     CALL THE OFFICE WITH ANY QUESTIONS OR CONCERNS: [phone]   VISIT OUR WEBSITE FOR ADDITIONAL INFORMATION: [email or web address]

## 2022-09-23 NOTE — Plan of Care (Signed)
Problem: Education: Goal: Knowledge of General Education information will improve Description: Including pain rating scale, medication(s)/side effects and non-pharmacologic comfort measures Outcome: Progressing   Problem: Health Behavior/Discharge Planning: Goal: Ability to manage health-related needs will improve Outcome: Progressing   Problem: Clinical Measurements: Goal: Ability to maintain clinical measurements within normal limits will improve Outcome: Progressing Goal: Will remain free from infection Outcome: Progressing Goal: Diagnostic test results will improve Outcome: Progressing Goal: Respiratory complications will improve Outcome: Progressing Goal: Cardiovascular complication will be avoided Outcome: Progressing   Problem: Pain Managment: Goal: General experience of comfort will improve Outcome: Progressing   Problem: Skin Integrity: Goal: Risk for impaired skin integrity will decrease Outcome: Progressing

## 2022-09-23 NOTE — TOC Transition Note (Addendum)
Transition of Care [facility]) - CM/SW Discharge Note   Patient Details  Name: Daniel Spence MRN: [id number] Date of Birth: 08-Apr-1990  [facility] CM/SW Contact:  Epifanio Lesches, RN Phone Number: 09/23/2022, 1:19 PM   Clinical Narrative:     Patient will DC to: home Anticipated DC date: 09/23/2022 Family notified: yes Transport by: car         S/p IM nail of R tibia on 9/14  Per MD patient ready for DC today . RN, patient, and patient's sister notified of DC. PTA pt states lived in a tree house . Pt will transition to sister's residence @ d/c. Sister to assist with care once d/c.Outpatient referral made with [facility]. and noted on AVS. Sister to assist with transportation needs. TOC pharmacy to deliver RX meds to bedside prior to d/c. Referral made with [facility] for  DME RW, BSC, TUB/Shower seat. Equipment will be delivered to bedside prior to d/c  Post hospital f/u noted on AVS. Sister to provide transportation to home. [name]  [phone]  RNCM will sign off for now as intervention is no longer needed. Please consult [place] again if new needs arise.         Patient Goals and CMS Choice      Discharge Placement                         Discharge Plan and Services Additional resources added to the After Visit Summary for                                       Social Determinants of Health (SDOH) Interventions SDOH Screenings   Tobacco Use: High Risk (09/19/2022)     Readmission Risk Interventions     No data to display
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
## 2022-09-23 NOTE — Discharge Summary (Signed)
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 10-14 days   Signed:  Mearl Latin, PA-C [phone] (C) 09/23/2022, 10:48 AM  [facility] [address] [place] [address] [phone] (F)
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
## 2022-09-30 LAB — THC,MS,WB/SP RFX
Cannabidiol: NEGATIVE ng/mL
Cannabinoid Confirmation: POSITIVE
Carboxy-THC: 1.7 ng/mL
Hydroxy-THC: NEGATIVE ng/mL
Tetrahydrocannabinol(THC): NEGATIVE ng/mL

## 2022-10-05 LAB — DRUG SCREEN 10 W/CONF, SERUM
Amphetamines, IA: NEGATIVE ng/mL
Barbiturates, IA: NEGATIVE ug/mL
Benzodiazepines, IA: NEGATIVE ng/mL
Cocaine & Metabolite, IA: NEGATIVE ng/mL
Methadone, IA: NEGATIVE ng/mL
Opiates, IA: NEGATIVE ng/mL
Oxycodones, IA: POSITIVE ng/mL — AB
Phencyclidine, IA: NEGATIVE ng/mL
Propoxyphene, IA: NEGATIVE ng/mL
THC(Marijuana) Metabolite, IA: POSITIVE ng/mL — AB

## 2022-10-05 LAB — OXYCODONES,MS,WB/SP RFX
Oxycocone: 18.2 ng/mL
Oxycodones Confirmation: POSITIVE
Oxymorphone: NEGATIVE ng/mL

## 2022-10-06 ENCOUNTER — Emergency Department (HOSPITAL_COMMUNITY)

## 2022-10-06 ENCOUNTER — Other Ambulatory Visit: Payer: Self-pay

## 2022-10-06 ENCOUNTER — Emergency Department (HOSPITAL_COMMUNITY)
Admission: EM | Admit: 2022-10-06 | Discharge: 2022-10-07 | Discharge status: Against Medical Advice | Attending: Emergency Medicine | Admitting: Emergency Medicine

## 2022-10-06 DIAGNOSIS — M79605 Pain in left leg: Secondary | ICD-10-CM | POA: Insufficient documentation

## 2022-10-06 DIAGNOSIS — R109 Unspecified abdominal pain: Secondary | ICD-10-CM | POA: Diagnosis not present

## 2022-10-06 DIAGNOSIS — F1910 Other psychoactive substance abuse, uncomplicated: Secondary | ICD-10-CM

## 2022-10-06 DIAGNOSIS — R519 Headache, unspecified: Secondary | ICD-10-CM | POA: Diagnosis not present

## 2022-10-06 DIAGNOSIS — I10 Essential (primary) hypertension: Secondary | ICD-10-CM | POA: Diagnosis not present

## 2022-10-06 DIAGNOSIS — R0789 Other chest pain: Secondary | ICD-10-CM | POA: Insufficient documentation

## 2022-10-06 DIAGNOSIS — Z7901 Long term (current) use of anticoagulants: Secondary | ICD-10-CM | POA: Diagnosis not present

## 2022-10-06 DIAGNOSIS — R443 Hallucinations, unspecified: Secondary | ICD-10-CM

## 2022-10-06 LAB — ETHANOL: Alcohol, Ethyl (B): <10 mg/dL (ref <10)

## 2022-10-06 LAB — RAPID URINE DRUG SCREEN, HOSP PERFORMED
Amphetamines: POSITIVE — AB
Barbiturates: NOT DETECTED
Benzodiazepines: NOT DETECTED
Cocaine: NOT DETECTED
Opiates: NOT DETECTED
Tetrahydrocannabinol: POSITIVE — AB

## 2022-10-06 LAB — CBC
HCT: 30 % — ABNORMAL LOW (ref 39.0–52.0)
Hemoglobin: 9.6 g/dL — ABNORMAL LOW (ref 13.0–17.0)
MCH: 29 pg (ref 26.0–34.0)
MCHC: 32 g/dL (ref 30.0–36.0)
MCV: 90.6 fL (ref 80.0–100.0)
Platelets: 619 10*3/uL — ABNORMAL HIGH (ref 150–400)
RBC: 3.31 MIL/uL — ABNORMAL LOW (ref 4.22–5.81)
RDW: 14.6 % (ref 11.5–15.5)
WBC: 10.4 10*3/uL (ref 4.0–10.5)
nRBC: 0 % (ref 0.0–0.2)

## 2022-10-06 LAB — COMPREHENSIVE METABOLIC PANEL
ALT: 58 U/L — ABNORMAL HIGH (ref 0–44)
AST: 68 U/L — ABNORMAL HIGH (ref 15–41)
Albumin: 3.9 g/dL (ref 3.5–5.0)
Alkaline Phosphatase: 88 U/L (ref 38–126)
Anion gap: 13 (ref 5–15)
BUN: 18 mg/dL (ref 6–20)
CO2: 24 mmol/L (ref 22–32)
Calcium: 9.7 mg/dL (ref 8.9–10.3)
Chloride: 99 mmol/L (ref 98–111)
Creatinine, Ser: 1.07 mg/dL (ref 0.61–1.24)
GFR, Estimated: >60 mL/min (ref >60)
Glucose, Bld: 102 mg/dL — ABNORMAL HIGH (ref 70–99)
Potassium: 3.5 mmol/L (ref 3.5–5.1)
Sodium: 136 mmol/L (ref 135–145)
Total Bilirubin: 0.9 mg/dL (ref 0.3–1.2)
Total Protein: 8 g/dL (ref 6.5–8.1)

## 2022-10-06 LAB — CBG MONITORING, ED: Glucose-Capillary: 99 mg/dL (ref 70–99)

## 2022-10-06 LAB — SALICYLATE LEVEL: Salicylate Lvl: <7 mg/dL — ABNORMAL LOW (ref 7.0–30.0)

## 2022-10-06 LAB — ACETAMINOPHEN LEVEL: Acetaminophen (Tylenol), Serum: <10 ug/mL — ABNORMAL LOW (ref 10–30)

## 2022-10-06 MED ORDER — LACTATED RINGERS IV BOLUS
1000.0000 mL | Freq: Once | INTRAVENOUS | Status: AC
  Administered 2022-10-06: 1000 mL via INTRAVENOUS

## 2022-10-06 MED ORDER — IOHEXOL 350 MG/ML SOLN
75.0000 mL | Freq: Once | INTRAVENOUS | Status: AC | PRN
  Administered 2022-10-06: 75 mL via INTRAVENOUS

## 2022-10-06 MED ORDER — KETOROLAC TROMETHAMINE 15 MG/ML IJ SOLN
15.0000 mg | Freq: Once | INTRAMUSCULAR | Status: DC
  Filled 2022-10-06: qty 1

## 2022-10-06 MED ORDER — LORAZEPAM 2 MG/ML IJ SOLN
2.0000 mg | Freq: Once | INTRAMUSCULAR | Status: AC
  Administered 2022-10-06: 2 mg via INTRAVENOUS
  Filled 2022-10-06: qty 1

## 2022-10-06 NOTE — ED Provider Notes (Signed)
  [place] EMERGENCY DEPARTMENT AT [facility] Provider Note   CSN: [id number] Arrival date & time: 10/06/22  1252  Procedures .Suture Removal  Date/Time: 10/06/2022 7:02 PM  Performed by: Peter Garter, PA Authorized by: Peter Garter, PA   Consent:    Consent obtained:  Emergent situation Universal protocol:    Relevant documents present and verified: yes     Patient identity confirmed:  Arm band Location:    Location:  Lower extremity   Lower extremity location:  Leg   Leg location:  L lower leg Procedure details:    Wound appearance:  Tender and warm   Number of sutures removed:  6 Post-procedure details:    Post-removal:  No dressing applied   Procedure completion:  Tolerated well, no immediate complications Comments:     Patient had partial suture removal by nursing staff but provider was called to bedside due to increased difficulty of suture removal.  Additionally, 6 sutures were removed as above.      Peter Garter, [place] 10/06/22 1904    Coral Spikes, DO 10/07/22 [phone]
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
## 2022-10-06 NOTE — ED Notes (Signed)
Pt is continually yelling out.  Talking about various conspiratorial sounding subjects related to wear he was arrested.  He also says he "brought something with him" that is messing with him.  He has asked for water and has been given water.  He was offered  toradol for possible pain.  He refused.

## 2022-10-06 NOTE — ED Notes (Signed)
Pt states a demon is holding him down
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
## 2022-10-06 NOTE — ED Provider Notes (Signed)
Received signout disposition pending CT scan for PE.  Reevaluated and reports leg is feeling improved.  He is seemingly quite delusional.  See ED course for further HPI Physical Exam  BP 132/76   Pulse 98   Temp 98.6 F (37 C)   Resp 17   Ht 6\' 3"  (1.905 m)   SpO2 100%   BMI 25.35 kg/m   Physical Exam Vitals and nursing note reviewed.  Constitutional:      General: He is not in acute distress.    Appearance: He is not toxic-appearing.  HENT:     Head: Normocephalic.     Nose: Nose normal.     Mouth/Throat:     Mouth: Mucous membranes are dry.  Eyes:     Conjunctiva/sclera: Conjunctivae normal.  Cardiovascular:     Rate and Rhythm: Normal rate and regular rhythm.  Pulmonary:     Effort: Pulmonary effort is normal.     Breath sounds: Normal breath sounds.  Abdominal:     General: Abdomen is flat. There is no distension.     Tenderness: There is no abdominal tenderness. There is no guarding or rebound.  Musculoskeletal:     Comments: Left lower extremity with surgical scar, sutures in place.  No purulent drainage or erythema.  Skin:    Capillary Refill: Capillary refill takes less than 2 seconds.  Neurological:     General: No focal deficit present.     Mental Status: He is alert.  Psychiatric:     Comments: Blunted affect.  Normal speech rate, low volume.  Delusional.       Procedures  Procedures  ED Course / MDM   Clinical Course as of 10/06/22 2306  Tue Oct 06, 2022  2136 Patient becoming more alert alert and oriented to person place and time.  Denies SI, HI, but endorses hallucinations that have been on going for the past couple years.  He is quite delusional and reports being in the woods with witches and something out there that can alter phones and that there is 3+3 in with a come together that is when you know you have issues.  Adamantly denies toxic ingestion prior to arrival with drugs and alcohol.  Per chart review does not have significant psych history.   He was quite agitated per reports from morning team and required Ativan.  Added on salicylate and acetaminophen levels.  Will plan for TTS/psych evaluation. [TY]  2235 Amphetamines(!): POSITIVE [TY]  2235 Tetrahydrocannabinol(!): POSITIVE [TY]    Clinical Course User Index [TY] Coral Spikes, DO   Medical Decision Making 32 year old presented in police custody with multiple complaints.  See morning team's note for full HPI.  Received Ativan prior to my arrival due to agitation and was quite somnolent and difficult to get history from initially.  After IV fluids and observation he has become more awake.  He is delusional and having hallucination, but denies SI or HI.  Reportedly has been having issues with hallucinations for the past couple years.  He denied drugs or alcohol use, however his amphetamine and THC positive today.  EtOH negative.  Ordered CT head, negative.  Will have TTS/psych evaluate patient.  Per chart review does not carry a formal diagnosis of any psychiatric issues. 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   Amount and/or Complexity of Data Reviewed Labs: ordered. Decision-making details documented in ED Course. Radiology: ordered.  Risk Prescription drug management.         Coral Spikes, DO  10/06/22 2306  

## 2022-10-06 NOTE — ED Triage Notes (Signed)
Pt states that he has not taken any of his medications in a couple days. Denies drugs or alcohol today.

## 2022-10-07 ENCOUNTER — Encounter (HOSPITAL_COMMUNITY): Payer: Self-pay

## 2022-10-07 NOTE — ED Notes (Signed)
Pt was seen attempting to leave the ED. Pt was asked to stay and wait fo rthe doctor to see the pt. Pt said, "I want to leave and I dont have to stay." Pt was asked to sign AMA form. RN attempted to get a WOW computer but pt had already left the department. Pt refused to go back to the room to sign AMA form. Pt did show both arms that no iv was present upon pt leaving the ED.
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
## 2022-10-07 NOTE — BH Assessment (Signed)
TTS attempted to assess pt. Pt was too somnolent for an assessment.

## 2022-10-12 ENCOUNTER — Ambulatory Visit: Attending: Orthopedic Surgery | Admitting: Physical Therapy

## 2022-10-13 ENCOUNTER — Ambulatory Visit: Payer: Medicaid Other | Admitting: Student

## 2022-10-22 ENCOUNTER — Ambulatory Visit

## 2022-11-10 ENCOUNTER — Ambulatory Visit: Admitting: Internal Medicine
